# Patient Record
Sex: Male | Born: 1965 | Race: Black or African American | Hispanic: No | Marital: Married | Smoking: Never smoker
Health system: Southern US, Community
[De-identification: ages and names within clinical notes are randomized; demographics above are authoritative.]

## PROBLEM LIST (undated history)

## (undated) DIAGNOSIS — E119 Type 2 diabetes mellitus without complications: Secondary | ICD-10-CM

## (undated) DIAGNOSIS — I10 Essential (primary) hypertension: Secondary | ICD-10-CM

## (undated) DIAGNOSIS — Z87442 Personal history of urinary calculi: Secondary | ICD-10-CM

## (undated) DIAGNOSIS — C801 Malignant (primary) neoplasm, unspecified: Secondary | ICD-10-CM

## (undated) HISTORY — PX: CYSTOSCOPY/RETROGRADE/URETEROSCOPY/STONE EXTRACTION WITH BASKET: SHX5317

---

## 1999-02-20 ENCOUNTER — Emergency Department (HOSPITAL_COMMUNITY): Admission: EM | Admit: 1999-02-20 | Discharge: 1999-02-20 | Payer: Self-pay | Admitting: *Deleted

## 2000-06-28 ENCOUNTER — Emergency Department (HOSPITAL_COMMUNITY): Admission: EM | Admit: 2000-06-28 | Discharge: 2000-06-28 | Payer: Self-pay | Admitting: Emergency Medicine

## 2001-11-18 ENCOUNTER — Emergency Department (HOSPITAL_COMMUNITY): Admission: EM | Admit: 2001-11-18 | Discharge: 2001-11-18 | Payer: Self-pay | Admitting: Emergency Medicine

## 2002-07-15 ENCOUNTER — Emergency Department (HOSPITAL_COMMUNITY): Admission: EM | Admit: 2002-07-15 | Discharge: 2002-07-15 | Payer: Self-pay | Admitting: Emergency Medicine

## 2004-02-22 ENCOUNTER — Emergency Department (HOSPITAL_COMMUNITY): Admission: EM | Admit: 2004-02-22 | Discharge: 2004-02-22 | Payer: Self-pay

## 2004-03-01 ENCOUNTER — Ambulatory Visit (HOSPITAL_BASED_OUTPATIENT_CLINIC_OR_DEPARTMENT_OTHER): Admission: RE | Admit: 2004-03-01 | Discharge: 2004-03-01 | Payer: Self-pay | Admitting: Urology

## 2010-07-21 ENCOUNTER — Emergency Department (HOSPITAL_COMMUNITY)
Admission: EM | Admit: 2010-07-21 | Discharge: 2010-07-21 | Disposition: A | Discharge status: Home / Self Care | Payer: No Typology Code available for payment source | Attending: Emergency Medicine | Admitting: Emergency Medicine

## 2010-07-21 DIAGNOSIS — E7800 Pure hypercholesterolemia, unspecified: Secondary | ICD-10-CM | POA: Insufficient documentation

## 2010-07-21 DIAGNOSIS — IMO0002 Reserved for concepts with insufficient information to code with codable children: Secondary | ICD-10-CM | POA: Insufficient documentation

## 2010-07-24 ENCOUNTER — Other Ambulatory Visit: Payer: Self-pay | Admitting: Family Medicine

## 2010-07-24 ENCOUNTER — Ambulatory Visit
Admission: RE | Admit: 2010-07-24 | Discharge: 2010-07-24 | Disposition: A | Discharge status: Home / Self Care | Payer: PRIVATE HEALTH INSURANCE | Source: Ambulatory Visit | Attending: Family Medicine | Admitting: Family Medicine

## 2010-07-24 DIAGNOSIS — IMO0002 Reserved for concepts with insufficient information to code with codable children: Secondary | ICD-10-CM

## 2012-11-07 ENCOUNTER — Emergency Department (HOSPITAL_COMMUNITY): Payer: BC Managed Care – PPO

## 2012-11-07 ENCOUNTER — Emergency Department (HOSPITAL_COMMUNITY)
Admission: EM | Admit: 2012-11-07 | Discharge: 2012-11-07 | Disposition: A | Discharge status: Home / Self Care | Payer: BC Managed Care – PPO | Attending: Emergency Medicine | Admitting: Emergency Medicine

## 2012-11-07 ENCOUNTER — Encounter (HOSPITAL_COMMUNITY): Payer: Self-pay | Admitting: *Deleted

## 2012-11-07 DIAGNOSIS — R112 Nausea with vomiting, unspecified: Secondary | ICD-10-CM | POA: Insufficient documentation

## 2012-11-07 DIAGNOSIS — N200 Calculus of kidney: Secondary | ICD-10-CM | POA: Insufficient documentation

## 2012-11-07 DIAGNOSIS — M549 Dorsalgia, unspecified: Secondary | ICD-10-CM | POA: Insufficient documentation

## 2012-11-07 DIAGNOSIS — Z79899 Other long term (current) drug therapy: Secondary | ICD-10-CM | POA: Insufficient documentation

## 2012-11-07 DIAGNOSIS — E119 Type 2 diabetes mellitus without complications: Secondary | ICD-10-CM | POA: Insufficient documentation

## 2012-11-07 DIAGNOSIS — D649 Anemia, unspecified: Secondary | ICD-10-CM | POA: Insufficient documentation

## 2012-11-07 HISTORY — DX: Type 2 diabetes mellitus without complications: E11.9

## 2012-11-07 LAB — CBC WITH DIFFERENTIAL/PLATELET
Basophils Absolute: 0 10*3/uL (ref 0.0–0.1)
Basophils Relative: 0 % (ref 0–1)
Eosinophils Absolute: 0 10*3/uL (ref 0.0–0.7)
Eosinophils Relative: 0 % (ref 0–5)
HCT: 31.3 % — ABNORMAL LOW (ref 39.0–52.0)
Hemoglobin: 10.9 g/dL — ABNORMAL LOW (ref 13.0–17.0)
Lymphs Abs: 1.1 10*3/uL (ref 0.7–4.0)
MCH: 30.7 pg (ref 26.0–34.0)
MCHC: 34.8 g/dL (ref 30.0–36.0)
MCV: 88.2 fL (ref 78.0–100.0)
Monocytes Absolute: 0.6 10*3/uL (ref 0.1–1.0)
Monocytes Relative: 7 % (ref 3–12)
Neutro Abs: 6.8 10*3/uL (ref 1.7–7.7)
Neutrophils Relative %: 80 % — ABNORMAL HIGH (ref 43–77)
Platelets: 225 10*3/uL (ref 150–400)
RBC: 3.55 MIL/uL — ABNORMAL LOW (ref 4.22–5.81)
RDW: 13.9 % (ref 11.5–15.5)
WBC: 8.5 10*3/uL (ref 4.0–10.5)

## 2012-11-07 LAB — URINALYSIS W MICROSCOPIC + REFLEX CULTURE
Bilirubin Urine: NEGATIVE
Ketones, ur: 15 mg/dL — AB
Leukocytes, UA: NEGATIVE
Nitrite: NEGATIVE
Protein, ur: NEGATIVE mg/dL
Specific Gravity, Urine: 1.031 — ABNORMAL HIGH (ref 1.005–1.030)
Urobilinogen, UA: 0.2 mg/dL (ref 0.0–1.0)
pH: 5.5 (ref 5.0–8.0)

## 2012-11-07 LAB — GLUCOSE, CAPILLARY: Glucose-Capillary: 106 mg/dL — ABNORMAL HIGH (ref 70–99)

## 2012-11-07 LAB — POCT I-STAT, CHEM 8
BUN: 15 mg/dL (ref 6–23)
Calcium, Ion: 1.17 mmol/L (ref 1.12–1.23)
Chloride: 107 mEq/L (ref 96–112)
Creatinine, Ser: 1.2 mg/dL (ref 0.50–1.35)
Glucose, Bld: 137 mg/dL — ABNORMAL HIGH (ref 70–99)
HCT: 34 % — ABNORMAL LOW (ref 39.0–52.0)
Hemoglobin: 11.6 g/dL — ABNORMAL LOW (ref 13.0–17.0)
Potassium: 3.5 mEq/L (ref 3.5–5.1)
Sodium: 140 mEq/L (ref 135–145)
TCO2: 20 mmol/L (ref 0–100)

## 2012-11-07 MED ORDER — IBUPROFEN 800 MG PO TABS: 800.0000 mg | ORAL_TABLET | Freq: Three times a day (TID) | ORAL | Status: DC

## 2012-11-07 MED ORDER — TAMSULOSIN HCL 0.4 MG PO CAPS: 0.4000 mg | ORAL_CAPSULE | Freq: Every day | ORAL | Status: DC

## 2012-11-07 MED ORDER — HYDROMORPHONE HCL PF 1 MG/ML IJ SOLN
1.0000 mg | Freq: Once | INTRAMUSCULAR | Status: AC
  Administered 2012-11-07: 1 mg via INTRAVENOUS
  Filled 2012-11-07: qty 1

## 2012-11-07 MED ORDER — ONDANSETRON HCL 4 MG/2ML IJ SOLN
4.0000 mg | Freq: Once | INTRAMUSCULAR | Status: AC
  Administered 2012-11-07: 4 mg via INTRAVENOUS
  Filled 2012-11-07: qty 2

## 2012-11-07 MED ORDER — KETOROLAC TROMETHAMINE 30 MG/ML IJ SOLN
30.0000 mg | Freq: Once | INTRAMUSCULAR | Status: AC
  Administered 2012-11-07: 30 mg via INTRAVENOUS
  Filled 2012-11-07: qty 1

## 2012-11-07 MED ORDER — OXYCODONE-ACETAMINOPHEN 5-325 MG PO TABS: 1.0000 | ORAL_TABLET | ORAL | Status: DC | PRN

## 2012-11-07 NOTE — ED Notes (Signed)
Pt knows that urine is needed. Pt unable to void at this time.

## 2012-11-07 NOTE — ED Provider Notes (Signed)
CSN: [id number]     Arrival date & time 11/07/12  0518 History     First MD Initiated Contact with Patient 11/07/12 0622     Chief Complaint  Patient presents with  . Flank Pain   (Consider location/radiation/quality/duration/timing/severity/associated sxs/prior Treatment) HPI Patient is a 47 year old male who presents to emergency department today complaining of a severe right flank pain onset about 2 hours ago. Patient states pain woke him up from sleep. The patient states that he is having some nausea, vomiting. Patient denies any changes in bowels. Patient states he did urinate this morning and now is normal with no blood seen in the urine no dysuria. Patient does have history of kidney stones but unsure if this feels like her prior kidney stone. Patient denies any recent back injuries. Patient denies any pain lower extremities of unknown numbness or weakness in lower extremities. Patient denies any abdominal pain. Patient did not take any medications prior to coming in. Past Medical History  Diagnosis Date  . Diabetes mellitus without complication    History reviewed. No pertinent past surgical history. No family history on file. History  Substance Use Topics  . Smoking status: Never Smoker   . Smokeless tobacco: Not on file  . Alcohol Use: Yes    Review of Systems  Constitutional: Negative for fever and chills.  HENT: Negative for neck pain and neck stiffness.   Respiratory: Negative for cough, chest tightness and shortness of breath.   Cardiovascular: Negative for chest pain, palpitations and leg swelling.  Gastrointestinal: Positive for nausea and vomiting. Negative for abdominal pain, diarrhea and abdominal distention.  Genitourinary: Positive for flank pain. Negative for dysuria, urgency, frequency and hematuria.  Musculoskeletal: Positive for back pain. Negative for myalgias and arthralgias.  Skin: Negative for rash.  Allergic/Immunologic: Negative for immunocompromised  state.  Neurological: Negative for dizziness, weakness, light-headedness, numbness and headaches.    Allergies  Review of patient's allergies indicates no known allergies.  Home Medications   Current Outpatient Rx  Name  Route  Sig  Dispense  Refill  . acetaminophen (TYLENOL) 500 MG tablet   Oral   Take 1,000 mg by mouth every 6 (six) hours as needed for pain.         Marland Kitchen atorvastatin (LIPITOR) 40 MG tablet   Oral   Take 40 mg by mouth daily.         . metFORMIN (GLUCOPHAGE) 500 MG tablet   Oral   Take 500 mg by mouth 2 (two) times daily with a meal.          BP 154/104  Pulse 78  Temp(Src) 98.6 F (37 C) (Oral)  Resp 22  SpO2 100% Physical Exam  Nursing note and vitals reviewed. Constitutional: He appears well-developed and well-nourished.  Pt appears in pain, he is moving around in bed unable to find comfortable position  HENT:  Head: Normocephalic and atraumatic.  Eyes: Conjunctivae are normal.  Neck: Neck supple.  Cardiovascular: Normal rate, regular rhythm and normal heart sounds.   Pulmonary/Chest: Effort normal. No respiratory distress. He has no wheezes. He has no rales.  Abdominal: Soft. Bowel sounds are normal. He exhibits no distension. There is no tenderness. There is no rebound.  Musculoskeletal: He exhibits no edema.  No lumbar midline or perivertebral tenderness  Neurological: He is alert.  Skin: Skin is warm and dry.    ED Course   Procedures (including critical care time)  Results for orders placed during the hospital encounter of  11/07/12  GLUCOSE, CAPILLARY      Result Value Range   Glucose-Capillary 106 (*) 70 - 99 mg/dL  URINALYSIS W MICROSCOPIC + REFLEX CULTURE      Result Value Range   Color, Urine YELLOW  YELLOW   APPearance CLOUDY (*) CLEAR   Specific Gravity, Urine 1.031 (*) 1.005 - 1.030   pH 5.5  5.0 - 8.0   Glucose, UA NEGATIVE  NEGATIVE mg/dL   Hgb urine dipstick SMALL (*) NEGATIVE   Bilirubin Urine NEGATIVE  NEGATIVE    Ketones, ur 15 (*) NEGATIVE mg/dL   Protein, ur NEGATIVE  NEGATIVE mg/dL   Urobilinogen, UA 0.2  0.0 - 1.0 mg/dL   Nitrite NEGATIVE  NEGATIVE   Leukocytes, UA NEGATIVE  NEGATIVE   RBC / HPF 0-2  <3 RBC/hpf  CBC WITH DIFFERENTIAL      Result Value Range   WBC 8.5  4.0 - 10.5 K/uL   RBC 3.55 (*) 4.22 - 5.81 MIL/uL   Hemoglobin 10.9 (*) 13.0 - 17.0 g/dL   HCT 16.1 (*) 09.6 - 04.5 %   MCV 88.2  78.0 - 100.0 fL   MCH 30.7  26.0 - 34.0 pg   MCHC 34.8  30.0 - 36.0 g/dL   RDW 40.9  81.1 - 91.4 %   Platelets 225  150 - 400 K/uL   Neutrophils Relative % 80 (*) 43 - 77 %   Neutro Abs 6.8  1.7 - 7.7 K/uL   Lymphocytes Relative 13  12 - 46 %   Lymphs Abs 1.1  0.7 - 4.0 K/uL   Monocytes Relative 7  3 - 12 %   Monocytes Absolute 0.6  0.1 - 1.0 K/uL   Eosinophils Relative 0  0 - 5 %   Eosinophils Absolute 0.0  0.0 - 0.7 K/uL   Basophils Relative 0  0 - 1 %   Basophils Absolute 0.0  0.0 - 0.1 K/uL  POCT I-STAT, CHEM 8      Result Value Range   Sodium 140  135 - 145 mEq/L   Potassium 3.5  3.5 - 5.1 mEq/L   Chloride 107  96 - 112 mEq/L   BUN 15  6 - 23 mg/dL   Creatinine, Ser 7.82  0.50 - 1.35 mg/dL   Glucose, Bld 956 (*) 70 - 99 mg/dL   Calcium, Ion 2.13  0.86 - 1.23 mmol/L   TCO2 20  0 - 100 mmol/L   Hemoglobin 11.6 (*) 13.0 - 17.0 g/dL   HCT 57.8 (*) 46.9 - 62.9 %   Ct Abdomen Pelvis Wo Contrast  11/07/2012   CLINICAL DATA:  Severe right flank pain. Nausea and vomiting. Diarrhea.  EXAM: CT ABDOMEN AND PELVIS WITHOUT CONTRAST  TECHNIQUE: Multidetector CT imaging of the abdomen and pelvis was performed following the standard protocol without intravenous contrast.  COMPARISON:  02/22/2004  FINDINGS: Multiple tiny less than 5 mm intrarenal calculi are seen bilaterally. Mild right hydronephrosis is seen, with a 5 mm calculus at the right ureteropelvic junction. No distal ureteral calculi visualized. Subparagraph noncontrast images of the head abdominal parenchymal organs are unremarkable as well  as the gallbladder. No soft tissue mass or inflammatory process identified. No evidence of dilated bowel loops or hernia.  IMPRESSION: 5 mm calculus at the right ureteropelvic junction, causing mild hydronephrosis.  Bilateral nephrolithiasis.   Electronically Signed   By: Myles Rosenthal   On: 11/07/2012 07:25     No results found. 1. Kidney stone on  right side   2. Anemia     MDM  PT with hx of kidney stones. Here in ED with complaint of a right upper flank pain that started just a few hours prior to arrival. Patient had a workup including blood work and noncontrasted CT abdomen and pelvis. His blood work is unremarkable except for slightly decreased hemoglobin level. Patient's CT scan showed a 5 mm calculus to the right ureteropelvic junction causing mild hydronephrosis. Patient was treated in March department with IV Dilaudid and Toradol. Patient is completely pain-free at this time. UA does not appear to be infected.  patient will be discharged home with followup with urology. Results plan discussed with patient and he agrees with it.  Filed Vitals:   11/07/12 0653 11/07/12 0700 11/07/12 0815 11/07/12 0954  BP: 154/104 146/97 124/77 120/74  Pulse:  78 92   Temp:      TempSrc:      Resp:    18  SpO2:  98% 98% 99%     Lottie Mussel, PA-C 11/07/12 1644

## 2012-11-07 NOTE — ED Notes (Signed)
The pt is c/o severe flank pain for 3-4 hours with n v and diarrhea.  Rt flank .  No bloody urine no difficulty voiding

## 2012-11-08 NOTE — ED Provider Notes (Signed)
Medical screening examination/treatment/procedure(s) were performed by non-physician practitioner and as supervising physician I was immediately available for consultation/collaboration.  Francisca Langenderfer, MD 11/08/12 0255 

## 2012-11-16 ENCOUNTER — Other Ambulatory Visit: Payer: Self-pay | Admitting: Urology

## 2012-11-17 ENCOUNTER — Encounter (HOSPITAL_COMMUNITY): Payer: Self-pay | Admitting: Pharmacy Technician

## 2012-11-17 ENCOUNTER — Encounter (HOSPITAL_COMMUNITY): Payer: Self-pay

## 2012-11-17 ENCOUNTER — Other Ambulatory Visit: Payer: Self-pay | Admitting: Urology

## 2012-11-17 ENCOUNTER — Encounter (HOSPITAL_COMMUNITY)
Admission: RE | Admit: 2012-11-17 | Discharge: 2012-11-17 | Disposition: A | Discharge status: Home / Self Care | Payer: BC Managed Care – PPO | Source: Ambulatory Visit | Attending: Urology | Admitting: Urology

## 2012-11-17 HISTORY — DX: Personal history of urinary calculi: Z87.442

## 2012-11-17 NOTE — Patient Instructions (Signed)
YOUR SURGERY IS SCHEDULED AT [facility]  ON:  Thursday  8/28  REPORT TO [place] [facility]AY [facility] AT:  7:30 AM      PHONE # FOR SHORT STAY IS [phone]  DO NOT EAT OR DRINK ANYTHING AFTER MIDNIGHT THE NIGHT BEFORE YOUR SURGERY.  YOU MAY BRUSH YOUR TEETH, RINSE OUT YOUR MOUTH--BUT NO WATER, NO FOOD, NO CHEWING GUM, NO MINTS, NO CANDIES, NO CHEWING TOBACCO.  PLEASE TAKE THE FOLLOWING MEDICATIONS THE AM OF YOUR SURGERY WITH A FEW SIPS OF WATER: PAIN MEDICATION IF NEEDED ( OXYCODONE/ACETAMINOPHEN)    IF YOU ARE DIABETIC:  DO NOT TAKE ANY DIABETIC MEDICATIONS THE AM OF YOUR SURGERY.    DO NOT BRING VALUABLES, MONEY, CREDIT CARDS.  DO NOT WEAR JEWELRY, MAKE-UP, NAIL POLISH AND NO METAL PINS OR CLIPS IN YOUR HAIR. CONTACT LENS, DENTURES / PARTIALS, GLASSES SHOULD NOT BE WORN TO SURGERY AND IN MOST CASES-HEARING AIDS WILL NEED TO BE REMOVED.  BRING YOUR GLASSES CASE, ANY EQUIPMENT NEEDED FOR YOUR CONTACT LENS. FOR PATIENTS ADMITTED TO THE HOSPITAL--CHECK OUT TIME THE DAY OF DISCHARGE IS 11:00 AM.  ALL INPATIENT ROOMS ARE PRIVATE - WITH BATHROOM, TELEPHONE, TELEVISION AND WIFI INTERNET.  IF YOU ARE BEING DISCHARGED THE SAME DAY OF YOUR SURGERY--YOU CAN NOT DRIVE YOURSELF HOME--AND SHOULD NOT GO HOME ALONE BY TAXI OR BUS.  NO DRIVING OR OPERATING MACHINERY FOR 24 HOURS FOLLOWING ANESTHESIA / PAIN MEDICATIONS.  PLEASE MAKE ARRANGEMENTS FOR SOMEONE TO BE WITH YOU AT HOME THE FIRST 24 HOURS AFTER SURGERY. RESPONSIBLE DRIVER'S NAME  CHERI Pennock                                               PHONE #  [phone]                            FAILURE TO FOLLOW THESE INSTRUCTIONS MAY RESULT IN THE CANCELLATION OF YOUR SURGERY.   PATIENT SIGNATURE_________________________________

## 2012-11-17 NOTE — Pre-Procedure Instructions (Signed)
PT HAD CBC AND I STAT CHEM 8 DONE IN ER 11/07/12 - RESULTS ARE IN EPIC. EKG WAS DONE TODAY AT [facility].

## 2012-11-18 ENCOUNTER — Encounter (HOSPITAL_COMMUNITY): Payer: Self-pay | Admitting: *Deleted

## 2012-11-18 ENCOUNTER — Encounter (HOSPITAL_COMMUNITY): Payer: Self-pay | Admitting: Certified Registered Nurse Anesthetist

## 2012-11-18 ENCOUNTER — Encounter (HOSPITAL_COMMUNITY)
Admission: RE | Disposition: A | Discharge status: Home / Self Care | Payer: Self-pay | Source: Ambulatory Visit | Attending: Urology

## 2012-11-18 ENCOUNTER — Ambulatory Visit (HOSPITAL_COMMUNITY): Payer: BC Managed Care – PPO | Admitting: Certified Registered Nurse Anesthetist

## 2012-11-18 ENCOUNTER — Ambulatory Visit (HOSPITAL_COMMUNITY)
Admission: RE | Admit: 2012-11-18 | Discharge: 2012-11-18 | Disposition: A | Discharge status: Home / Self Care | Payer: BC Managed Care – PPO | Source: Ambulatory Visit | Attending: Urology | Admitting: Urology

## 2012-11-18 DIAGNOSIS — N200 Calculus of kidney: Secondary | ICD-10-CM

## 2012-11-18 DIAGNOSIS — E119 Type 2 diabetes mellitus without complications: Secondary | ICD-10-CM | POA: Insufficient documentation

## 2012-11-18 DIAGNOSIS — N201 Calculus of ureter: Secondary | ICD-10-CM | POA: Insufficient documentation

## 2012-11-18 HISTORY — PX: CYSTOSCOPY WITH RETROGRADE PYELOGRAM, URETEROSCOPY AND STENT PLACEMENT: SHX5789

## 2012-11-18 HISTORY — PX: HOLMIUM LASER APPLICATION: SHX5852

## 2012-11-18 LAB — GLUCOSE, CAPILLARY: Glucose-Capillary: 99 mg/dL (ref 70–99)

## 2012-11-18 SURGERY — CYSTOURETEROSCOPY, WITH RETROGRADE PYELOGRAM AND STENT INSERTION
Anesthesia: General | Laterality: Right | Wound class: Clean Contaminated

## 2012-11-18 MED ORDER — ACETAMINOPHEN 500 MG PO TABS
1000.0000 mg | ORAL_TABLET | Freq: Once | ORAL | Status: AC
  Administered 2012-11-18: 1000 mg via ORAL
  Filled 2012-11-18: qty 2

## 2012-11-18 MED ORDER — CEFAZOLIN SODIUM-DEXTROSE 2-3 GM-% IV SOLR
INTRAVENOUS | Status: AC
  Filled 2012-11-18: qty 50

## 2012-11-18 MED ORDER — LACTATED RINGERS IV SOLN: INTRAVENOUS | Status: DC

## 2012-11-18 MED ORDER — FENTANYL CITRATE 0.05 MG/ML IJ SOLN
INTRAMUSCULAR | Status: DC | PRN
  Administered 2012-11-18 (×4): 50 ug via INTRAVENOUS

## 2012-11-18 MED ORDER — LACTATED RINGERS IV SOLN
INTRAVENOUS | Status: DC
  Administered 2012-11-18: 1000 mL via INTRAVENOUS

## 2012-11-18 MED ORDER — FENTANYL CITRATE 0.05 MG/ML IJ SOLN
INTRAMUSCULAR | Status: AC
  Filled 2012-11-18: qty 2

## 2012-11-18 MED ORDER — PROPOFOL 10 MG/ML IV BOLUS
INTRAVENOUS | Status: DC | PRN
  Administered 2012-11-18: 200 mg via INTRAVENOUS

## 2012-11-18 MED ORDER — LIDOCAINE HCL (CARDIAC) 20 MG/ML IV SOLN
INTRAVENOUS | Status: DC | PRN
  Administered 2012-11-18: 100 mg via INTRAVENOUS

## 2012-11-18 MED ORDER — CIPROFLOXACIN HCL 250 MG PO TABS: 250.0000 mg | ORAL_TABLET | Freq: Two times a day (BID) | ORAL | Status: DC

## 2012-11-18 MED ORDER — ACETAMINOPHEN 500 MG PO TABS
ORAL_TABLET | ORAL | Status: AC
  Administered 2012-11-18: 1000 mg via ORAL
  Filled 2012-11-18: qty 2

## 2012-11-18 MED ORDER — URIBEL 118 MG PO CAPS: 1.0000 | ORAL_CAPSULE | Freq: Three times a day (TID) | ORAL | Status: AC | PRN

## 2012-11-18 MED ORDER — FENTANYL CITRATE 0.05 MG/ML IJ SOLN
25.0000 ug | INTRAMUSCULAR | Status: DC | PRN
  Administered 2012-11-18: 25 ug via INTRAVENOUS
  Administered 2012-11-18: 50 ug via INTRAVENOUS

## 2012-11-18 MED ORDER — URELLE 81 MG PO TABS
1.0000 | ORAL_TABLET | Freq: Once | ORAL | Status: AC
  Administered 2012-11-18: 81 mg via ORAL
  Filled 2012-11-18: qty 1

## 2012-11-18 MED ORDER — IOHEXOL 300 MG/ML  SOLN
INTRAMUSCULAR | Status: AC
  Filled 2012-11-18: qty 1

## 2012-11-18 MED ORDER — CEFAZOLIN SODIUM-DEXTROSE 2-3 GM-% IV SOLR
2.0000 g | INTRAVENOUS | Status: AC
  Administered 2012-11-18: 2 g via INTRAVENOUS

## 2012-11-18 MED ORDER — MIDAZOLAM HCL 5 MG/5ML IJ SOLN
INTRAMUSCULAR | Status: DC | PRN
  Administered 2012-11-18: 2 mg via INTRAVENOUS

## 2012-11-18 MED ORDER — SODIUM CHLORIDE 0.9 % IR SOLN
Status: DC | PRN
  Administered 2012-11-18: 5000 mL

## 2012-11-18 MED ORDER — IOHEXOL 300 MG/ML  SOLN
INTRAMUSCULAR | Status: DC | PRN
  Administered 2012-11-18: 10 mL via URETHRAL

## 2012-11-18 MED ORDER — ONDANSETRON HCL 4 MG/2ML IJ SOLN
INTRAMUSCULAR | Status: DC | PRN
  Administered 2012-11-18: 4 mg via INTRAVENOUS

## 2012-11-18 SURGICAL SUPPLY — 22 items
BAG URO CATCHER STRL LF (DRAPE) ×2 IMPLANT
CATH CLEAR GEL 3F BACKSTOP (CATHETERS) ×1 IMPLANT
CATH INTERMIT  6FR 70CM (CATHETERS) ×1 IMPLANT
CATH URET 5FR 28IN CONE TIP (BALLOONS)
CATH URET 5FR 28IN OPEN ENDED (CATHETERS) ×1 IMPLANT
CATH URET 5FR 70CM CONE TIP (BALLOONS) ×1 IMPLANT
CLOTH BEACON ORANGE TIMEOUT ST (SAFETY) ×2 IMPLANT
DRAPE CAMERA CLOSED 9X96 (DRAPES) ×2 IMPLANT
GLOVE BIOGEL M 8.0 STRL (GLOVE) ×4 IMPLANT
GLOVE SURG SS PI 8.0 STRL IVOR (GLOVE) ×1 IMPLANT
GOWN PREVENTION PLUS XLARGE (GOWN DISPOSABLE) ×1 IMPLANT
GOWN STRL REIN XL XLG (GOWN DISPOSABLE) ×2 IMPLANT
GUIDEWIRE ANG ZIPWIRE 038X150 (WIRE) ×1 IMPLANT
GUIDEWIRE STR DUAL SENSOR (WIRE) ×3 IMPLANT
LASER FIBER DISP (UROLOGICAL SUPPLIES) ×1 IMPLANT
MANIFOLD NEPTUNE II (INSTRUMENTS) ×2 IMPLANT
MARKER SKIN DUAL TIP RULER LAB (MISCELLANEOUS) ×1 IMPLANT
PACK CYSTO (CUSTOM PROCEDURE TRAY) ×2 IMPLANT
SHEATH ACCESS URETERAL 38CM (SHEATH) ×1 IMPLANT
STENT CONTOUR 6FRX24X.038 (STENTS) ×1 IMPLANT
TUBING CONNECTING 10 (TUBING) ×2 IMPLANT
WIRE COONS/BENSON .038X145CM (WIRE) ×1 IMPLANT

## 2012-11-18 NOTE — H&P (Signed)
  Urology History and Physical Exam  CC: Kidney stone   HPI: 47 year old male presents for ureteroscopic treatment of a symptomatic right mid ureteral stone.He originally presented to Dr. Vernie Ammons on 8/18 for followup. His note is below:  He presented to the emergency room on 11/07/12 with acute onset right flank pain. A CT scan at that time revealed a 5 mm right UPJ stone as well as nonobstructing bilateral renal calculi that are essentially unchanged from 2005. The stone in his proximal ureter is the largest stone that was in his right kidney previously. He has been having moderate pain and finds that the oxycodone has not been efficient at controlling his pain. He's not having any nausea, vomiting or hematuria. His pain is moderate in severity. Is not modified by positional change.  PMH: Past Medical History  Diagnosis Date  . Diabetes mellitus without complication     ORAL MED - NEW DIAGNOSIS  . History of kidney stones     RIGHT SIDE - CAUSING SEVERE PAIN - PREVIOUS STONE/ SURG    PSH: Past Surgical History  Procedure Laterality Date  . Cystoscopy/retrograde/ureteroscopy/stone extraction with basket      Allergies: No Known Allergies  Medications: No prescriptions prior to admission     Social History: History   Social History  . Marital Status: Married    Spouse Name: N/A    Number of Children: N/A  . Years of Education: N/A   Occupational History  . Not on file.   Social History Main Topics  . Smoking status: Never Smoker   . Smokeless tobacco: Never Used  . Alcohol Use: Yes     Comment: OCCAS  . Drug Use: No  . Sexual Activity: Not on file   Other Topics Concern  . Not on file   Social History Narrative  . No narrative on file    Family History: No family history on file.  Review of Systems: Positive:  Negative:   A further 10 point review of systems was negative except what is listed in the HPI.  Physical Exam: @VITALS2 @ Constitutional: Well  nourished and well developed . No acute distress.  ENT:. The ears and nose are normal in appearance.  Neck: The appearance of the neck is normal and no neck mass is present.  Pulmonary: No respiratory distress and normal respiratory rhythm and effort.  Cardiovascular: Heart rate and rhythm are normal . No peripheral edema.  Abdomen: The abdomen is soft and nontender. No masses are palpated. No CVA tenderness. No hernias are palpable. No hepatosplenomegaly noted.  Lymphatics: The femoral and inguinal nodes are not enlarged or tender.  Skin: Normal skin turgor, no visible rash and no visible skin lesions.  Neuro/Psych:. Mood and affect are appropriate.      Studies:  No results found for this basename: HGB, WBC, PLT,  in the last 72 hours  No results found for this basename: NA, K, CL, CO2, BUN, CREATININE, CALCIUM, MAGNESIUM, GFRNONAA, GFRAA,  in the last 72 hours   No results found for this basename: PT, INR, APTT,  in the last 72 hours   No components found with this basename: ABG,     Assessment:  5mm right mid ureteral stone  Plan: Right ureteroscopic management of 5 mm right mid ureteral stone

## 2012-11-18 NOTE — Interval H&P Note (Signed)
History and Physical Interval Note:  11/18/2012 9:24 AM  William Kaiser  has presented today for surgery, with the diagnosis of RIGHT DISTAL URETERAL STONE  The various methods of treatment have been discussed with the patient and family. After consideration of risks, benefits and other options for treatment, the patient has consented to  Procedure(s): CYSTOSCOPY WITH RETROGRADE PYELOGRAM, URETEROSCOPY AND STONE EXTRACTION AND POSSIBLE DOUBLE J STENT PLACEMENT (Right) HOLMIUM LASER APPLICATION (Right) as a surgical intervention .  The patient's history has been reviewed, patient examined, no change in status, stable for surgery.  I have reviewed the patient's chart and labs.  Questions were answered to the patient's satisfaction.     Chelsea Aus

## 2012-11-18 NOTE — Op Note (Signed)
Preoperative diagnosis: 5 mm right ureteral stone, proximal, as well as small renal calculi  Postoperative diagnosis: Same   Procedure: Cystoscopy, right retrograde ureteropyelogram with interpretation of fluoroscopy, rigid and flexible ureteroscopy, holmium laser and extraction of right ureteral calculus, placement of 24 cm x 6 French contour stent with string    Surgeon: Bertram Millard. Karole Oo, M.D.   Anesthesia: Gen.   Complications: None  Specimen(s): Stone fragments, the patient's wife  Drain(s): 24 cm x 6 French contour stent  Indications: 47 year-old male with symptomatic 5-6 mm proximal right ureteral stone. He seeks treatment for this, as he has been under a fair amount of pain, and desires ureteroscopy over lithotripsy. He was seen by Dr. Vernie Ammons in by Ardell Isaacs, nurse practitioner in our office within the past 2 weeks. Because of his urgent need for stone management, he was scheduled for me to perform this procedure. I have met with the patient prior to the procedure, discussed it with him in depth, as well as risks and complications. These include but are not limited to bleeding, ureteral trauma, infection, flank pain, among others. He understands this and desires to proceed.    Technique and findings: The patient was properly identified and marked in the holding area. He received preoperative IV Ancef. He was taken the operating room where general anesthetic was administered. He was placed in the dorsolithotomy position. Genitalia and perineum were prepped and draped. Proper timeout was then performed.  I then passed a 22 French cystoscope through his urethra which was normal prostate was nonobstructive. I entered the bladder and inspected circumferentially. There were no tumors, trabeculations or foreign bodies. Both ureteral orifices were normal in configuration and location. A right retrograde ureteropyelogram was then performed. This revealed a normal ureter with a filling  defect seen consistent with the stone. There was not significant proximal hydroureteronephrosis. Pyelocalyceal system was normal. I saw no significant filling defects.  At this point, I then removed the cystoscope. I dilated the distal ureter with the inner core of a digital access sheath. I then passed the ureteroscope, with the use of the additional guidewire through the ureteroscope due to angulation of the ureter. I then encountered the stone proximally in the ureter. Distal, there were no lesions or stones. Because the stone was quite high in the ureter, it applied backstop proximal to the stone. I then passed a 360  laser fiber and applied laser energy to the stone, fragmenting it into approximately 10 pieces. These were then easily extracted down to the bladder. I then inspected the spot where the stone had been treated. There were just small dustlike fragments left in this area. I then removed the ureteroscope. I passed a digital access sheath proximally in the ureter over the guidewire. I then passed, through this, the digital ureteroscope. I inspected the calyceal system. I saw no significant stones that would need to be extracted. The entire pyelocalyceal system was inspected with the flexible scope. There were couple very small fragments which I thought would easily pass along the stent following the procedure. These were quite small and I felt would not be able to be grasped with the basket. The entire right kidney having been inspected, I removed the ureteroscope and the digital access sheath. Over top of the guidewire, using cystoscopic guidance, I then placed a 24 cm x 6 French contour stent. Good proximal and distal curls were seen fluoroscopically and cystoscopically at this point. The string was left on. The bladder was drained.  Small fragments were aspirated from the bladder and capped. The string was brought through the urethra after the bladder was drained and the scope removed.  The  patient tolerated procedure well. He was awakened and taken to the PACU in stable condition.  He will followup on September 12. He was discharged on Cipro and his pain medications as well as uribel. If he does well at the followup, I would recommend that he followup in approximately 6-12 months with repeat KUB.

## 2012-11-18 NOTE — Anesthesia Preprocedure Evaluation (Addendum)
Anesthesia Evaluation  Patient identified by MRN, date of birth, ID band Patient awake    Reviewed: Allergy & Precautions, H&P , NPO status , Patient's Chart, lab work & pertinent test results  Airway Mallampati: II TM Distance: >3 FB Neck ROM: full    Dental no notable dental hx. (+) Teeth Intact and Dental Advisory Given   Pulmonary neg pulmonary ROS,  breath sounds clear to auscultation  Pulmonary exam normal       Cardiovascular Exercise Tolerance: Good negative cardio ROS  Rhythm:regular Rate:Normal     Neuro/Psych negative neurological ROS  negative psych ROS   GI/Hepatic negative GI ROS, Neg liver ROS,   Endo/Other  diabetes, Well Controlled, Type 2, Oral Hypoglycemic Agents  Renal/GU negative Renal ROS  negative genitourinary   Musculoskeletal   Abdominal   Peds  Hematology negative hematology ROS (+)   Anesthesia Other Findings   Reproductive/Obstetrics negative OB ROS                          Anesthesia Physical Anesthesia Plan  ASA: II  Anesthesia Plan: General   Post-op Pain Management:    Induction: Intravenous  Airway Management Planned: LMA  Additional Equipment:   Intra-op Plan:   Post-operative Plan:   Informed Consent: I have reviewed the patients History and Physical, chart, labs and discussed the procedure including the risks, benefits and alternatives for the proposed anesthesia with the patient or authorized representative who has indicated his/her understanding and acceptance.   Dental Advisory Given  Plan Discussed with: CRNA and Surgeon  Anesthesia Plan Comments:         Anesthesia Quick Evaluation  

## 2012-11-18 NOTE — Transfer of Care (Signed)
Immediate Anesthesia Transfer of Care Note  Patient: William Kaiser  Procedure(s) Performed: Procedure(s): CYSTOSCOPY WITH RETROGRADE PYELOGRAM, URETEROSCOPY AND STONE EXTRACTION AND right DOUBLE J STENT PLACEMENT (Right) HOLMIUM LASER APPLICATION (Right)  Patient Location: PACU  Anesthesia Type:General  Level of Consciousness: awake and alert   Airway & Oxygen Therapy: Patient Spontanous Breathing and Patient connected to face mask oxygen  Post-op Assessment: Report given to PACU RN and Post -op Vital signs reviewed and stable  Post vital signs: Reviewed and stable  Complications: No apparent anesthesia complications

## 2012-11-18 NOTE — Anesthesia Postprocedure Evaluation (Signed)
  Anesthesia Post-op Note  Patient: William Kaiser  Procedure(s) Performed: Procedure(s) (LRB): CYSTOSCOPY WITH RETROGRADE PYELOGRAM, URETEROSCOPY AND STONE EXTRACTION AND right DOUBLE J STENT PLACEMENT (Right) HOLMIUM LASER APPLICATION (Right)  Patient Location: PACU  Anesthesia Type: General  Level of Consciousness: awake and alert   Airway and Oxygen Therapy: Patient Spontanous Breathing  Post-op Pain: mild  Post-op Assessment: Post-op Vital signs reviewed, Patient's Cardiovascular Status Stable, Respiratory Function Stable, Patent Airway and No signs of Nausea or vomiting  Last Vitals:  Filed Vitals:   11/18/12 1200  BP: 150/87  Pulse: 60  Temp: 36.1 C  Resp: 12    Post-op Vital Signs: stable   Complications: No apparent anesthesia complications

## 2012-11-18 NOTE — Preoperative (Signed)
Beta Blockers   Reason not to administer Beta Blockers:Not Applicable 

## 2012-11-19 ENCOUNTER — Encounter (HOSPITAL_COMMUNITY): Payer: Self-pay | Admitting: Urology

## 2012-11-26 ENCOUNTER — Encounter (HOSPITAL_BASED_OUTPATIENT_CLINIC_OR_DEPARTMENT_OTHER): Admission: RE | Payer: Self-pay | Source: Ambulatory Visit

## 2012-11-26 ENCOUNTER — Ambulatory Visit (HOSPITAL_BASED_OUTPATIENT_CLINIC_OR_DEPARTMENT_OTHER): Admission: RE | Admit: 2012-11-26 | Payer: PRIVATE HEALTH INSURANCE | Source: Ambulatory Visit | Admitting: Urology

## 2012-11-26 SURGERY — CYSTOURETEROSCOPY, WITH RETROGRADE PYELOGRAM AND STENT INSERTION
Anesthesia: General | Laterality: Right

## 2015-04-12 IMAGING — CT CT ABD-PELV W/O CM
2 of 4 series · 17 of 46 positions shown, 19 images · non-contrast
Comparison: 02/22/2004

CLINICAL DATA: Severe right flank pain. Nausea and vomiting.
Diarrhea.

EXAM:
CT ABDOMEN AND PELVIS WITHOUT CONTRAST
TECHNIQUE: Multidetector CT imaging of the abdomen and pelvis was performed
following the standard protocol without intravenous contrast.

[Series 2: stone study 5.0 i30f 1 · axial · 0.88mm/px · z∈[-1029,-579]mm · 14 of 100 slices shown, 16 images]
[im 5/100  soft-tissue]
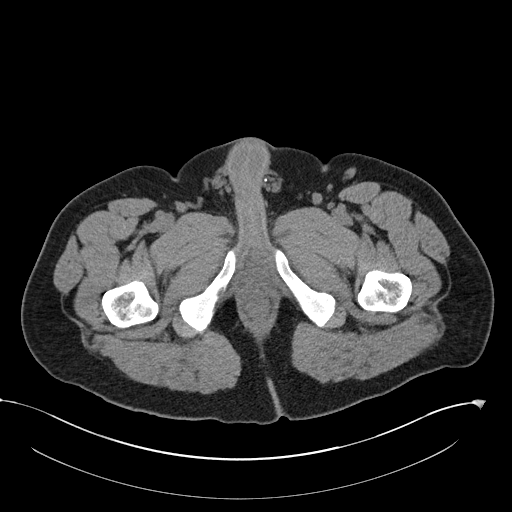
[im 5/100  bone]
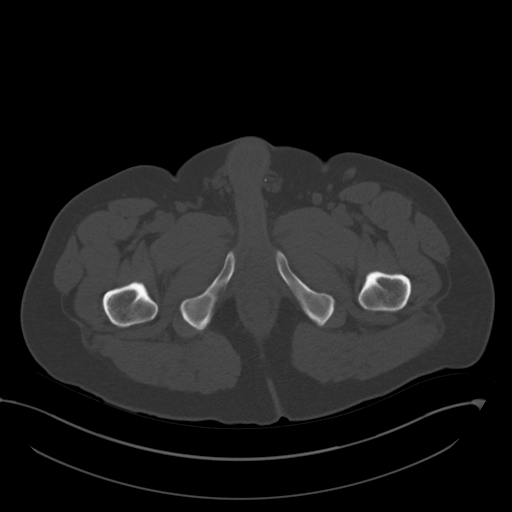
[im 13/100  soft-tissue]
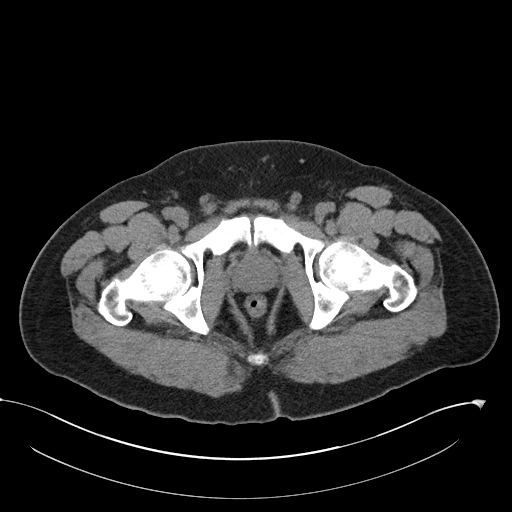
[im 18/100  soft-tissue]
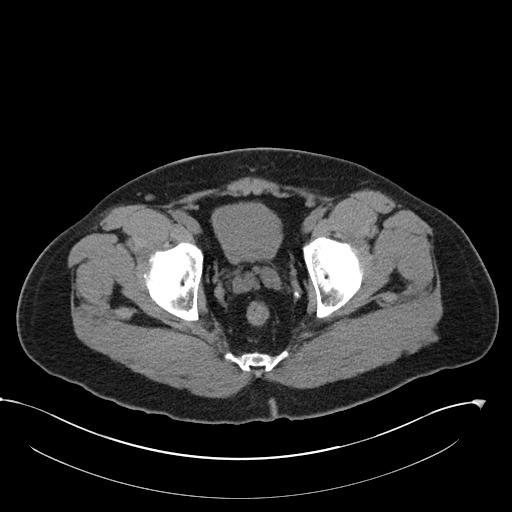
[im 26/100  soft-tissue]
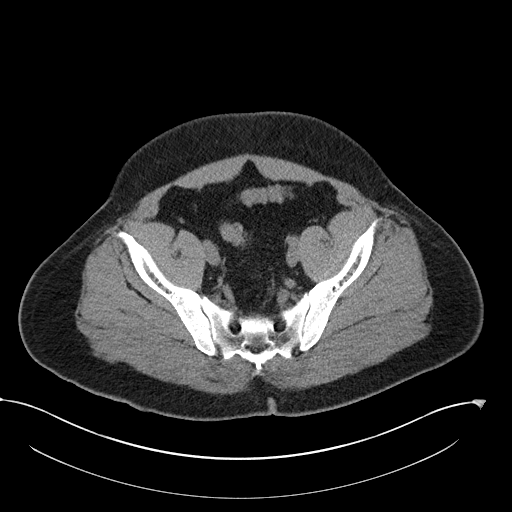
[im 35/100  soft-tissue]
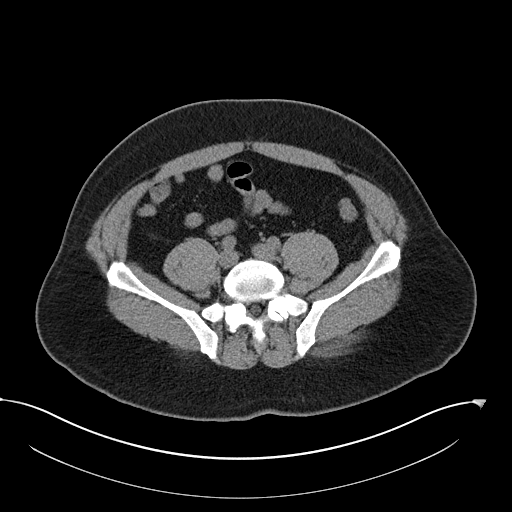
[im 39/100  soft-tissue]
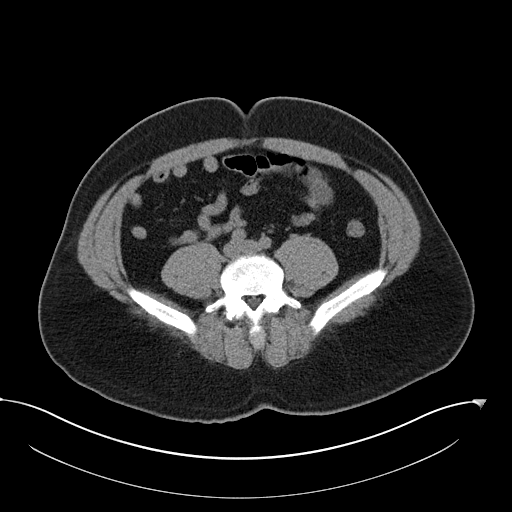
[im 48/100  soft-tissue]
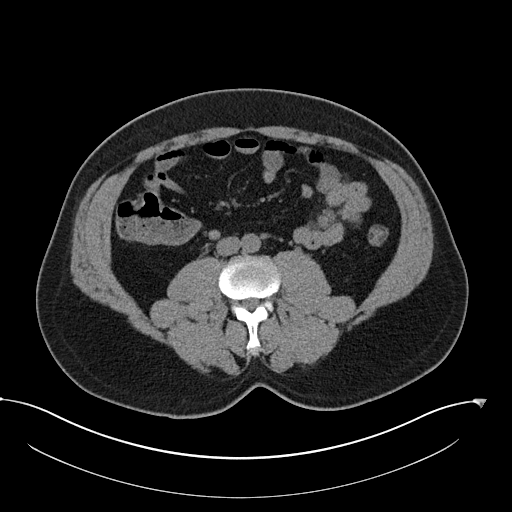
[im 52/100  soft-tissue]
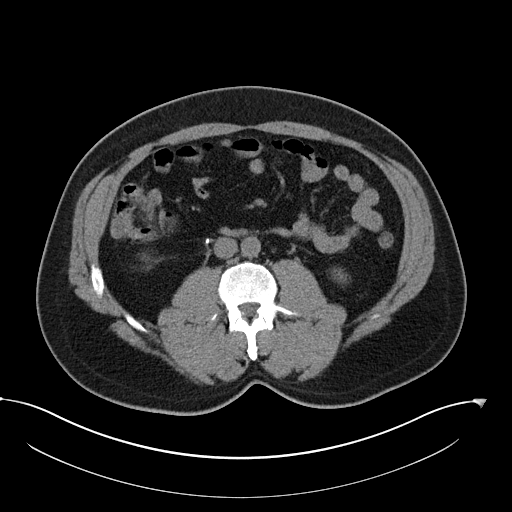
[im 61/100  soft-tissue]
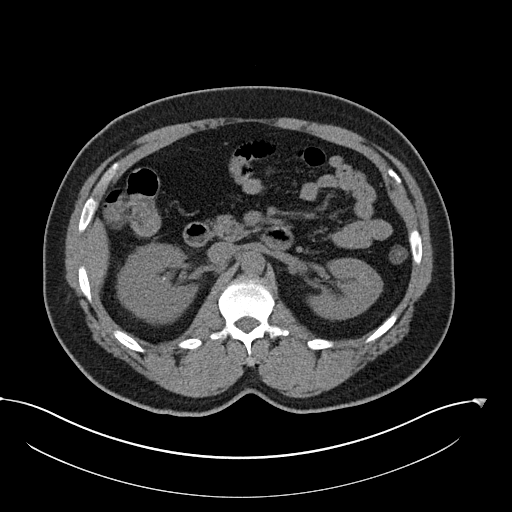
[im 61/100  bone]
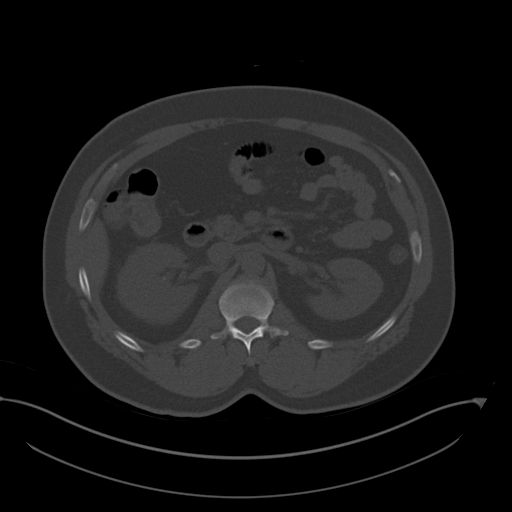
[im 65/100  soft-tissue]
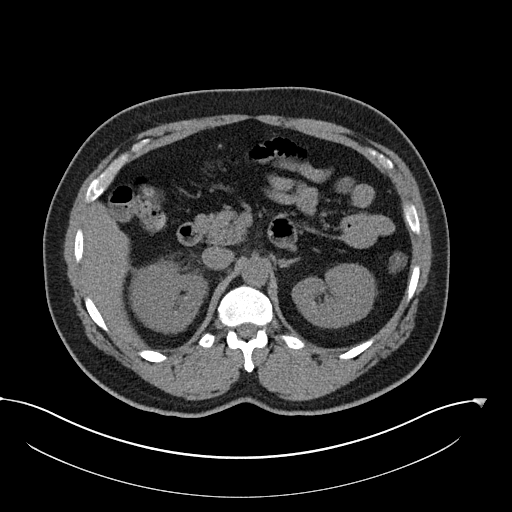
[im 74/100  soft-tissue]
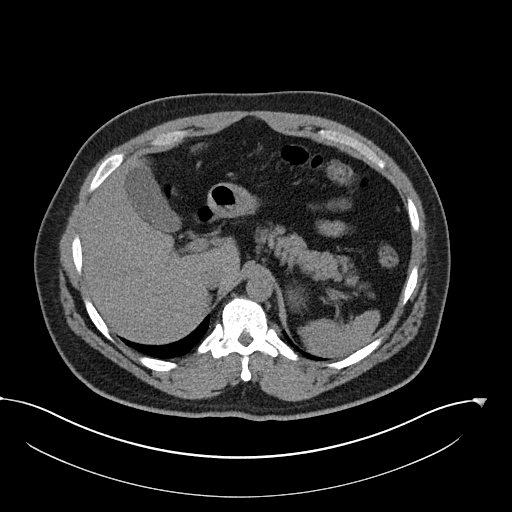
[im 82/100  soft-tissue]
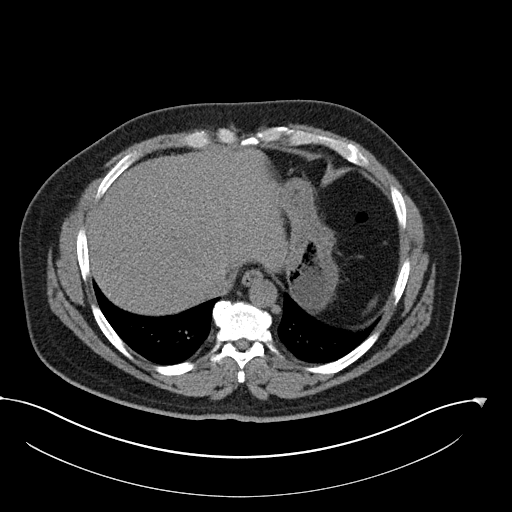
[im 87/100  soft-tissue]
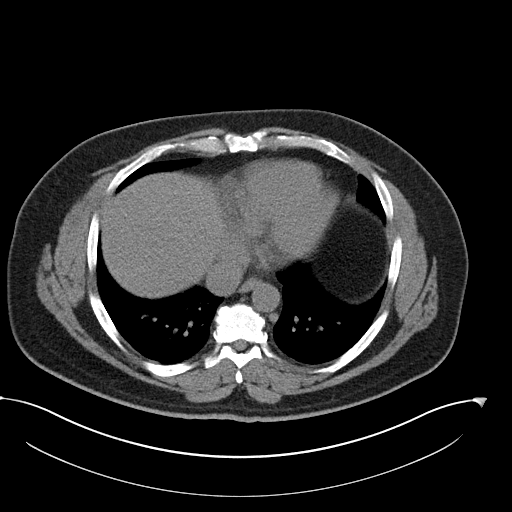
[im 95/100  soft-tissue]
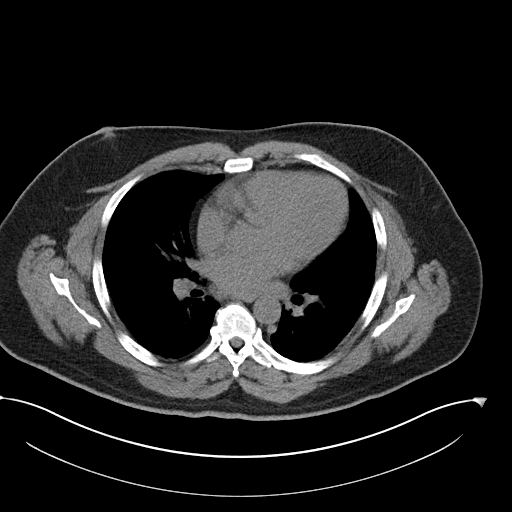

[Series 5: coronal soft tissue · coronal · 0.95mm/px · 3 of 80 slices shown]
[im 27/80  soft-tissue]
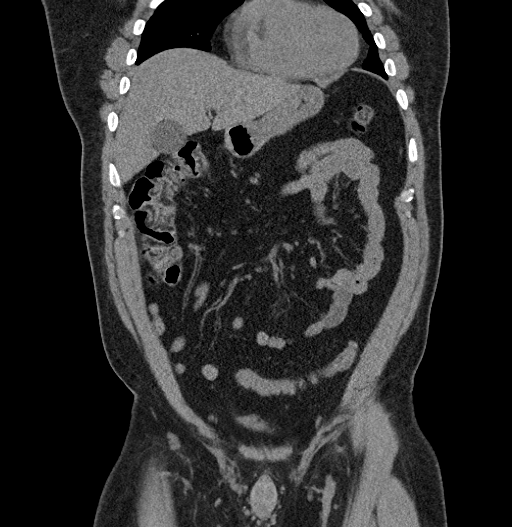
[im 36/80  soft-tissue]
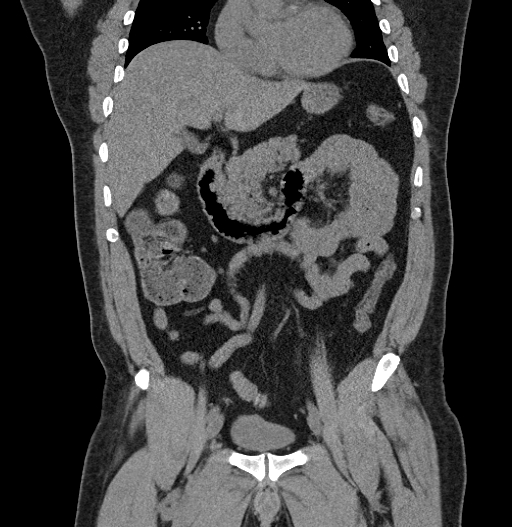
[im 44/80  soft-tissue]
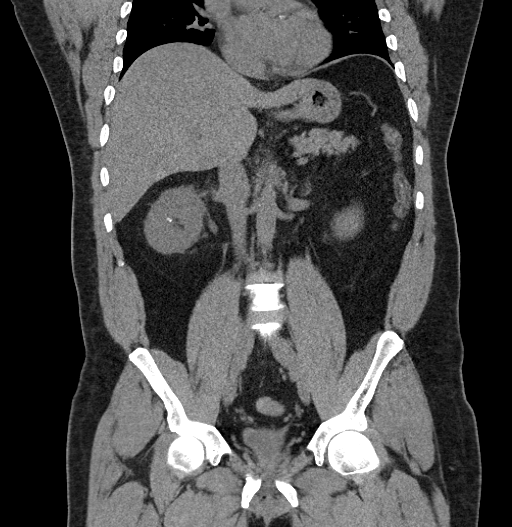

[17 of 46 positions shown; findings below may reference images not displayed]

FINDINGS: Multiple tiny less than 5 mm intrarenal calculi are seen
bilaterally. Mild right hydronephrosis is seen, with a 5 mm calculus
at the right ureteropelvic junction. No distal ureteral calculi
visualized. Subparagraph noncontrast images of the head abdominal
parenchymal organs are unremarkable as well as the gallbladder. No
soft tissue mass or inflammatory process identified. No evidence of
dilated bowel loops or hernia.
IMPRESSION: 5 mm calculus at the right ureteropelvic junction, causing mild
hydronephrosis.

Bilateral nephrolithiasis.

## 2018-08-23 MED FILL — ATORVASTATIN 40 MG TABLET: 40 | 90 days supply | Qty: 90 | Fill #0

## 2019-08-11 ENCOUNTER — Other Ambulatory Visit (HOSPITAL_COMMUNITY): Payer: Self-pay | Admitting: Internal Medicine

## 2020-01-13 ENCOUNTER — Other Ambulatory Visit (HOSPITAL_COMMUNITY): Payer: Self-pay | Admitting: Internal Medicine

## 2020-01-23 ENCOUNTER — Ambulatory Visit: Payer: PRIVATE HEALTH INSURANCE | Attending: Internal Medicine

## 2020-01-23 DIAGNOSIS — Z23 Encounter for immunization: Secondary | ICD-10-CM

## 2020-01-23 NOTE — Progress Notes (Signed)
   Covid-19 Vaccination Clinic  Name:  William Kaiser    MRN: [id number] DOB: 16-Feb-1966  01/23/2020  [name] was observed post Covid-19 immunization for 15 minutes without incident. He was provided with Vaccine Information Sheet and instruction to access the V-Safe system.   [name] was instructed to call 911 with any severe reactions post vaccine: Marland Kitchen Difficulty breathing  . Swelling of face and throat  . A fast heartbeat  . A bad rash all over body  . Dizziness and weakness

## 2020-04-17 ENCOUNTER — Other Ambulatory Visit (HOSPITAL_COMMUNITY): Payer: Self-pay | Admitting: Urology

## 2020-05-09 ENCOUNTER — Other Ambulatory Visit (HOSPITAL_COMMUNITY): Payer: Self-pay | Admitting: Internal Medicine

## 2020-08-27 ENCOUNTER — Other Ambulatory Visit (HOSPITAL_COMMUNITY): Payer: Self-pay

## 2020-08-27 MED ORDER — ATORVASTATIN CALCIUM 40 MG PO TABS
1.0000 | ORAL_TABLET | Freq: Every day | ORAL | 0 refills | Status: DC
  Filled 2020-08-27: qty 90, 90d supply, fill #0

## 2020-08-27 MED FILL — Losartan Potassium Tab 50 MG: ORAL | 90 days supply | Qty: 90 | Fill #0 | Status: AC

## 2020-09-27 ENCOUNTER — Other Ambulatory Visit (HOSPITAL_COMMUNITY): Payer: Self-pay

## 2020-12-25 ENCOUNTER — Other Ambulatory Visit (HOSPITAL_COMMUNITY): Payer: Self-pay

## 2020-12-25 MED FILL — Losartan Potassium Tab 50 MG: ORAL | 90 days supply | Qty: 90 | Fill #1 | Status: CN

## 2020-12-26 ENCOUNTER — Other Ambulatory Visit (HOSPITAL_COMMUNITY): Payer: Self-pay

## 2020-12-26 MED ORDER — ATORVASTATIN CALCIUM 40 MG PO TABS
40.0000 mg | ORAL_TABLET | Freq: Every day | ORAL | 3 refills | Status: DC
  Filled 2020-12-26: qty 90, 90d supply, fill #0
  Filled 2021-04-23: qty 90, 90d supply, fill #1
  Filled 2021-09-19: qty 90, 90d supply, fill #2

## 2021-01-18 ENCOUNTER — Other Ambulatory Visit (HOSPITAL_COMMUNITY): Payer: Self-pay

## 2021-01-18 MED FILL — Losartan Potassium Tab 50 MG: ORAL | 90 days supply | Qty: 90 | Fill #1 | Status: AC

## 2021-01-21 ENCOUNTER — Other Ambulatory Visit (HOSPITAL_COMMUNITY): Payer: Self-pay

## 2021-01-24 ENCOUNTER — Other Ambulatory Visit (HOSPITAL_COMMUNITY): Payer: Self-pay

## 2021-01-24 MED ORDER — LEVOFLOXACIN 750 MG PO TABS
750.0000 mg | ORAL_TABLET | ORAL | 0 refills | Status: DC
  Filled 2021-01-24: qty 1, 1d supply, fill #0

## 2021-04-01 NOTE — Progress Notes (Signed)
GU Location of Tumor / Histology: Prostate Ca  If Prostate Cancer, Gleason Score is (3 + 3) and PSA is (4.5 as of 01/15/2021)  Biopsies: Dr. Abner Greenspan     Past/Anticipated interventions by urology, if any:    Weight changes, if any:  No   IPSS:   3 SHIM:  21  Bowel/Bladder complaints, if any:  No bowel or bladder issues.  Nausea/Vomiting, if any:  No  Pain issues, if any:  2/10  SAFETY ISSUES: Prior radiation?  No Pacemaker/ICD?  No Possible current pregnancy?  Male Is the patient on methotrexate?  No  Current Complaints / other details:  Need more information on treatment options.

## 2021-04-08 DIAGNOSIS — C61 Malignant neoplasm of prostate: Secondary | ICD-10-CM | POA: Insufficient documentation

## 2021-04-08 NOTE — Progress Notes (Signed)
Radiation Oncology         [phone] ________________________________  Initial Outpatient Consultation  Name: William Kaiser MRN: [id number]  Date: 04/11/2021  DOB: 1965-09-18  [name], No Pcp Per (Inactive)  William Lima, MD   REFERRING PHYSICIAN: Janith [place], MD  DIAGNOSIS: 56 y.o. gentleman with Stage T1c adenocarcinoma of the prostate with Gleason score of 3+3, and PSA of 4.5.    ICD-10-CM   1. Malignant neoplasm of prostate ([place])  C61       HISTORY OF PRESENT ILLNESS: William Kaiser is a 56 y.o. male with a diagnosis of prostate cancer. He was noted to have an elevated PSA of 4.03 by his primary care physician, Dr. Seward Kaiser on 01/06/20 with a brother diagnosed with prostate cancer in his 39's.  Accordingly, he was referred for evaluation in urology by Dr. Abner Kaiser on 04/17/20.  TRUS with biopsy was discussed and recommended.  The patient elected for PSA follow-up, and PSA at [facility] on 10/09/20 was 3.24.  PSA at [facility] on 01/15/21 was higher at 4.5.   The patient proceeded to transrectal ultrasound with 12 biopsies of the prostate on 03/05/21.  The prostate volume measured 44.92 cc.  Out of 12 core biopsies, 6 were positive.  The maximum Gleason score was 3+3, and this was seen in the left lateral base, right base, right lateral base, left mid, right mid and right lateral apex.  The patient reviewed the biopsy results with his urologist and he has kindly been referred today for discussion of potential radiation treatment options.   PREVIOUS RADIATION THERAPY: No  PAST MEDICAL HISTORY:  Past Medical History:  Diagnosis Date   Diabetes mellitus without complication ([place])    ORAL MED - NEW DIAGNOSIS   History of kidney stones    RIGHT SIDE - CAUSING SEVERE PAIN - PREVIOUS STONE/ SURG      PAST SURGICAL HISTORY: Past Surgical History:  Procedure Laterality Date   CYSTOSCOPY WITH RETROGRADE PYELOGRAM, URETEROSCOPY AND STENT PLACEMENT Right 11/18/2012    Procedure: CYSTOSCOPY WITH RETROGRADE PYELOGRAM, URETEROSCOPY AND STONE EXTRACTION AND right DOUBLE J STENT PLACEMENT;  Surgeon: William Gallo, MD;  Location: [facility];  Service: Urology;  Laterality: Right;   CYSTOSCOPY/RETROGRADE/URETEROSCOPY/STONE EXTRACTION WITH BASKET     HOLMIUM LASER APPLICATION Right 7/82/9562   Procedure: HOLMIUM LASER APPLICATION;  Surgeon: William Gallo, MD;  Location: [facility];  Service: Urology;  Laterality: Right;    FAMILY HISTORY: No family history on file.  SOCIAL HISTORY:  Social History   Socioeconomic History   Marital status: Married    Spouse name: Not on file   Number of children: Not on file   Years of education: Not on file   Highest education level: Not on file  Occupational History   Not on file  Tobacco Use   Smoking status: Never   Smokeless tobacco: Never  Substance and Sexual Activity   Alcohol use: Yes    Comment: OCCAS   Drug use: No   Sexual activity: Not on file  Other Topics Concern   Not on file  Social History Narrative   Not on file   Social Determinants of Health   Financial Resource Strain: Not on file  Food Insecurity: Not on file  Transportation Needs: Not on file  Physical Activity: Not on file  Stress: Not on file  Social Connections: Not on file  Intimate Partner Violence: Not on file    ALLERGIES: Patient has no known allergies.  MEDICATIONS:  Current Outpatient Medications  Medication Sig Dispense Refill   atorvastatin (LIPITOR) 40 MG tablet Take 40 mg by mouth every evening.      calcium carbonate (OS-CAL) 1250 (500 Ca) MG chewable tablet Chew 1 tablet by mouth daily.     losartan (COZAAR) 50 MG tablet TAKE 1 TABLET BY MOUTH ONCE A DAY 90 tablet 3   metFORMIN (GLUCOPHAGE) 500 MG tablet Take 500 mg by mouth 2 (two) times daily with a meal.     atorvastatin (LIPITOR) 40 MG tablet TAKE 1 TABLET BY MOUTH ONCE A DAY 90 tablet 3   atorvastatin (LIPITOR) 40 MG tablet TAKE 1 TABLET BY MOUTH ONCE A DAY  (Patient not taking: Reported on 04/11/2021) 90 tablet 0   atorvastatin (LIPITOR) 40 MG tablet Take 1 tablet (40 mg total) by mouth daily. 90 tablet 3   ciprofloxacin (CIPRO) 250 MG tablet Take 1 tablet (250 mg total) by mouth 2 (two) times daily. 10 tablet 0   HYDROmorphone (DILAUDID) 4 MG tablet Take 4 mg by mouth every 4 (four) hours as needed for pain. (Patient not taking: Reported on 04/11/2021)     ibuprofen (ADVIL,MOTRIN) 800 MG tablet Take 1 tablet (800 mg total) by mouth 3 (three) times daily. 21 tablet 0   levofloxacin (LEVAQUIN) 750 MG tablet TAKE 1 TABLET BY MOUTH 1 HOUR PRIOR TO BIOPSY (Patient not taking: Reported on 04/11/2021) 1 tablet 0   levofloxacin (LEVAQUIN) 750 MG tablet Take 1 tablet (750 mg total) by mouth as directed one hour before biopsy (Patient not taking: Reported on 04/11/2021) 1 tablet 0   losartan (COZAAR) 50 MG tablet Take 1 tablet by mouth daily.     metFORMIN (GLUCOPHAGE) 500 MG tablet TAKE ONE (1) TABLET BY MOUTH TWO (2) TIMES DAILY 180 tablet 3   Meth-Hyo-M Bl-Na Phos-Ph Sal (URIBEL) 118 MG CAPS Take 1 capsule (118 mg total) by mouth every 8 (eight) hours as needed. (Patient not taking: Reported on 04/11/2021) 20 capsule 1   Multiple Vitamin (MULTIVITAMIN ADULT PO) 1 tablet     oxyCODONE-Acetaminophen ER 7.5-325 MG TBCR Take 1 tablet by mouth 2 (two) times daily. (Patient not taking: Reported on 04/11/2021)     No current facility-administered medications for this encounter.    REVIEW OF SYSTEMS:  On review of systems, the patient reports that he is doing well overall. He denies any chest pain, shortness of breath, cough, fevers, chills, night sweats, unintended weight changes. He denies any bowel disturbances, and denies abdominal pain, nausea or vomiting. He denies any new musculoskeletal or joint aches or pains. His IPSS was Total Score: 3, indicating mild urinary symptoms. His SHIM: 21, indicating he does not have significant erectile dysfunction. A complete review  of systems is obtained and is otherwise negative.   PHYSICAL EXAM:  Wt Readings from Last 3 Encounters:  04/11/21 219 lb 8 oz (99.6 kg)  11/17/12 212 lb (96.2 kg)   Temp Readings from Last 3 Encounters:  04/11/21 (!) 96.7 F (35.9 C) (Temporal)  11/18/12 97.9 F (36.6 C) (Oral)  11/17/12 97.1 F (36.2 C) (Oral)   BP Readings from Last 3 Encounters:  04/11/21 (!) 147/89  11/18/12 (!) 146/94  11/17/12 126/85   Pulse Readings from Last 3 Encounters:  04/11/21 96  11/18/12 86  11/17/12 (!) 102   Pain Assessment Pain Score: 2  Pain Loc: Leg (right)/10  In general this is a well appearing gentleman in no acute distress. He's alert and oriented x4  and appropriate throughout the examination. Cardiopulmonary assessment is negative for acute distress, and he exhibits normal effort.    KPS = 100  100 - Normal; no complaints; no evidence of disease. 90   - Able to carry on normal activity; minor signs or symptoms of disease. 80   - Normal activity with effort; some signs or symptoms of disease. 36   - Cares for self; unable to carry on normal activity or to do active work. 60   - Requires occasional assistance, but is able to care for most of his personal needs. 50   - Requires considerable assistance and frequent medical care. 55   - Disabled; requires special care and assistance. 52   - Severely disabled; hospital admission is indicated although death not imminent. 20   - Very sick; hospital admission necessary; active supportive treatment necessary. 10   - Moribund; fatal processes progressing rapidly. 0     - Dead  Karnofsky DA, Abelmann [place], Craver LS and Burchenal [facility] [phone]) The use of the nitrogen mustards in the palliative treatment of carcinoma: with particular reference to bronchogenic carcinoma Cancer 1 634-56  LABORATORY DATA:  Lab Results  Component Value Date   WBC 8.5 11/07/2012   HGB 11.6 (L) 11/07/2012   HCT 34.0 (L) 11/07/2012   MCV 88.2 11/07/2012   PLT 225  11/07/2012   Lab Results  Component Value Date   NA 140 11/07/2012   K 3.5 11/07/2012   CL 107 11/07/2012   No results found for: ALT, AST, GGT, ALKPHOS, BILITOT   RADIOGRAPHY: No results found.    IMPRESSION/PLAN: 1. 56 y.o. gentleman with Stage T1c adenocarcinoma of the prostate with Gleason score of 3+3, and PSA of 4.5.  We discussed the patient's workup and outlined the nature of prostate cancer in this setting. The patient's T stage, Gleason's score, and PSA put him into the Favorable risk group. Accordingly, he is eligible for a variety of potential treatment options including active surveillance, brachytherapy, 5.5 weeks of external radiation, or prostatectomy. We discussed the available radiation techniques, and focused on the details and logistics of delivery.  We discussed and outlined the risks, benefits, short and long-term effects associated with radiotherapy and compared and contrasted these with prostatectomy. We discussed the role of SpaceOAR gel in reducing the rectal toxicity associated with radiotherapy.  He appears to have a good understanding of his disease and our treatment recommendations which are of curative intent.  He was encouraged to ask questions that were answered to his stated satisfaction.  At the conclusion of our conversation, the patient is interested in moving forward with robotic assisted laparoscopic radical prostatectomy.  We personally spent 60 minutes in this encounter including chart review, reviewing radiological studies, meeting face-to-face with the patient, entering orders and completing documentation.      Tyler Pita, MD  [facility]   Radiation Oncology Direct Dial: [phone]   Fax: [phone] [email or web address]   Skype   LinkedIn

## 2021-04-11 ENCOUNTER — Ambulatory Visit
Admission: RE | Admit: 2021-04-11 | Discharge: 2021-04-11 | Disposition: A | Discharge status: Home / Self Care | Payer: No Typology Code available for payment source | Source: Ambulatory Visit | Attending: Radiation Oncology | Admitting: Radiation Oncology

## 2021-04-11 ENCOUNTER — Other Ambulatory Visit: Payer: Self-pay

## 2021-04-11 VITALS — BP 147/89 | HR 96 | Temp 96.7°F | Resp 18 | Ht 66.0 in | Wt 219.5 lb

## 2021-04-11 DIAGNOSIS — Z87442 Personal history of urinary calculi: Secondary | ICD-10-CM | POA: Diagnosis not present

## 2021-04-11 DIAGNOSIS — Z7984 Long term (current) use of oral hypoglycemic drugs: Secondary | ICD-10-CM | POA: Diagnosis not present

## 2021-04-11 DIAGNOSIS — Z791 Long term (current) use of non-steroidal anti-inflammatories (NSAID): Secondary | ICD-10-CM | POA: Diagnosis not present

## 2021-04-11 DIAGNOSIS — C61 Malignant neoplasm of prostate: Secondary | ICD-10-CM | POA: Insufficient documentation

## 2021-04-11 DIAGNOSIS — Z79899 Other long term (current) drug therapy: Secondary | ICD-10-CM | POA: Insufficient documentation

## 2021-04-11 DIAGNOSIS — E119 Type 2 diabetes mellitus without complications: Secondary | ICD-10-CM | POA: Insufficient documentation

## 2021-04-11 NOTE — Progress Notes (Signed)
Introduced myself to the patient, and his wife William Kaiser, as the prostate nurse navigator.  No barriers to care identified at this time.  He is here to discuss his radiation treatment options.  He does have a surgical consult on 1/25 as well.  I gave him my business card and asked him to call me with questions or concerns.  Verbalized understanding.

## 2021-04-15 ENCOUNTER — Other Ambulatory Visit: Payer: Self-pay | Admitting: Urology

## 2021-04-23 ENCOUNTER — Other Ambulatory Visit (HOSPITAL_COMMUNITY): Payer: Self-pay

## 2021-04-23 MED ORDER — ALPRAZOLAM 0.25 MG PO TABS
0.2500 mg | ORAL_TABLET | Freq: Every day | ORAL | 0 refills | Status: AC | PRN
  Filled 2021-04-23: qty 15, 15d supply, fill #0

## 2021-04-23 MED FILL — Losartan Potassium Tab 50 MG: ORAL | 90 days supply | Qty: 90 | Fill #0 | Status: AC

## 2021-05-03 NOTE — Patient Instructions (Addendum)
DUE TO COVID-19 ONLY ONE VISITOR IS ALLOWED TO COME WITH YOU AND STAY IN THE WAITING ROOM ONLY DURING PRE OP AND PROCEDURE.   **NO VISITORS ARE ALLOWED IN THE SHORT STAY AREA OR RECOVERY ROOM!!**  IF YOU WILL BE ADMITTED INTO THE HOSPITAL YOU ARE ALLOWED ONLY TWO SUPPORT PEOPLE DURING VISITATION HOURS ONLY (7 AM -8PM)   The support person(s) must pass our screening, gel in and out, and wear a mask at all times, including in the patients room. Patients must also wear a mask when staff or their support person are in the room. Visitors GUEST BADGE MUST BE WORN VISIBLY  One adult visitor may remain with you overnight and MUST be in the room by 8 P.M.  No visitors under the age of 17. Any visitor under the age of 40 must be accompanied by an adult.    COVID SWAB TESTING MUST BE COMPLETED ON:  05/22/21   Site: [facility] [place] [facility] [place] Enter: Main Entrance have a seat in the waiting area to the right of main entrance (DO NOT [place]!!!!!) Dial: [phone] to alert staff you have arrived  You are not required to quarantine, however you are required to wear a well-fitted mask when you are out and around people not in your household.  Hand Hygiene often Do NOT share personal items Notify your provider if you are in close contact with someone who has COVID or you develop fever 100.4 or greater, new onset of sneezing, cough, sore throat, shortness of breath or body aches.  [place], [address], must go inside of the hospital, NOT A DRIVE THRU!  (Must self quarantine after testing. Follow instructions on handout.)       Your procedure is scheduled on: 05/24/21   Report to [facility] Main Entrance    Report to short stay at: 5:15 AM   Call this number if you have problems the morning of surgery [phone]  CLEAR LIQUID DIET: [place].  Foods Allowed                                                                      Foods Excluded  Water, Black Coffee and tea, regular and decaf                             liquids that you cannot  Plain Jell-O in any flavor  (No red)                                           see through such as: Fruit ices (not with fruit pulp)                                     milk, soups, orange juice              Iced Popsicles (No red)  All solid food                                   Apple juices Sports drinks like Gatorade (No red) Lightly seasoned clear broth or consume(fat free) Sugar Sample Menu Breakfast                                Lunch                                     Supper Cranberry juice                    Beef broth                            Chicken broth Jell-O                                     Grape juice                           Apple juice Coffee or tea                        Jell-O                                      Popsicle                                                Coffee or tea                        Coffee or tea    FOLLOW BOWEL PREP AND ANY ADDITIONAL PRE OP INSTRUCTIONS YOU RECEIVED FROM YOUR SURGEON'S OFFICE!!!  Dissolve 17 grams in 4 ounces of water AND DRINK AT NOON DAY BEFORE SURGERY    USE ONE FLEET ENEMA NIGHT BEFORE SURGERY  Oral Hygiene is also important to reduce your risk of infection.                                    Remember - BRUSH YOUR TEETH THE MORNING OF SURGERY WITH YOUR REGULAR TOOTHPASTE   Do NOT smoke after Midnight   Take these medicines the morning of surgery with A SIP OF WATER:   DO NOT TAKE ANY ORAL DIABETIC MEDICATIONS DAY OF YOUR SURGERY                              You may not have any metal on your body including hair pins, jewelry, and body piercing             Do not wear make-up, lotions, powders, perfumes/cologne, or deodorant  Do not wear nail polish including gel and S&S, artificial/acrylic  nails, or  any other type of covering on natural nails including finger and toenails. If you have artificial nails, gel coating, etc. that needs to be removed by a nail salon please have this removed prior to surgery or surgery may need to be canceled/ delayed if the surgeon/ anesthesia feels like they are unable to be safely monitored.   Do not shave  48 hours prior to surgery.               Men may shave face and neck.   Do not bring valuables to the hospital. [place].   Contacts, dentures or bridgework may not be worn into surgery.   Bring small overnight bag day of surgery.    Patients discharged on the day of surgery will not be allowed to drive home.  Someone needs to stay with you for the first 24 hours after anesthesia.   Special Instructions: Bring a copy of your healthcare power of attorney and living will documents         the day of surgery if you haven't scanned them before.              Please read over the following fact sheets you were given: IF YOU HAVE QUESTIONS ABOUT YOUR PRE-OP INSTRUCTIONS PLEASE CALL [phone]     [facility] - Preparing for Surgery Before surgery, you can play an important role.  Because skin is not sterile, your skin needs to be as free of germs as possible.  You can reduce the number of germs on your skin by washing with CHG (chlorahexidine gluconate) soap before surgery.  CHG is an antiseptic cleaner which kills germs and bonds with the skin to continue killing germs even after washing. Please DO NOT use if you have an allergy to CHG or antibacterial soaps.  If your skin becomes reddened/irritated stop using the CHG and inform your nurse when you arrive at Short Stay. Do not shave (including legs and underarms) for at least 48 hours prior to the first CHG shower.  You may shave your face/neck. Please follow these instructions carefully:  1.  Shower with CHG Soap the night before surgery and the  morning of  Surgery.  2.  If you choose to wash your hair, wash your hair first as usual with your  normal  shampoo.  3.  After you shampoo, rinse your hair and body thoroughly to remove the  shampoo.                           4.  Use CHG as you would any other liquid soap.  You can apply chg directly  to the skin and wash                       Gently with a scrungie or clean washcloth.  5.  Apply the CHG Soap to your body ONLY FROM THE NECK DOWN.   Do not use on face/ open                           Wound or open sores. Avoid contact with eyes, ears mouth and genitals (private parts).  Wash face,  Genitals (private parts) with your normal soap.             6.  Wash thoroughly, paying special attention to the area where your surgery  will be performed.  7.  Thoroughly rinse your body with warm water from the neck down.  8.  DO NOT shower/wash with your normal soap after using and rinsing off  the CHG Soap.                9.  Pat yourself dry with a clean towel.            10.  Wear clean pajamas.            11.  Place clean sheets on your bed the night of your first shower and do not  sleep with pets. Day of Surgery : Do not apply any lotions/deodorants the morning of surgery.  Please wear clean clothes to the hospital/surgery center.  FAILURE TO FOLLOW THESE INSTRUCTIONS MAY RESULT IN THE CANCELLATION OF YOUR SURGERY PATIENT SIGNATURE_________________________________  NURSE SIGNATURE__________________________________  ________________________________________________________________________   Adam [place]  An incentive spirometer is a tool that can help keep your lungs clear and active. This tool measures how well you are filling your lungs with each breath. Taking long deep breaths may help reverse or decrease the chance of developing breathing (pulmonary) problems (especially infection) following: A long period of time when you are unable to move or be active. BEFORE THE  PROCEDURE  If the spirometer includes an indicator to show your best effort, your nurse or respiratory therapist will set it to a desired goal. If possible, sit up straight or lean slightly forward. Try not to slouch. Hold the incentive spirometer in an upright position. INSTRUCTIONS FOR USE  Sit on the edge of your bed if possible, or sit up as far as you can in bed or on a chair. Hold the incentive spirometer in an upright position. Breathe out normally. Place the mouthpiece in your mouth and seal your lips tightly around it. Breathe in slowly and as deeply as possible, raising the piston or the ball toward the top of the column. Hold your breath for 3-5 seconds or for as long as possible. Allow the piston or ball to fall to the bottom of the column. Remove the mouthpiece from your mouth and breathe out normally. Rest for a few seconds and repeat Steps 1 through 7 at least 10 times every 1-2 hours when you are awake. Take your time and take a few normal breaths between deep breaths. The spirometer may include an indicator to show your best effort. Use the indicator as a goal to work toward during each repetition. After each set of 10 deep breaths, practice coughing to be sure your lungs are clear. If you have an incision (the cut made at the time of surgery), support your incision when coughing by placing a pillow or rolled up towels firmly against it. Once you are able to get out of bed, walk around indoors and cough well. You may stop using the incentive spirometer when instructed by your caregiver.  RISKS AND COMPLICATIONS Take your time so you do not get dizzy or light-headed. If you are in pain, you may need to take or ask for pain medication before doing incentive spirometry. It is harder to take a deep breath if you are having pain. AFTER USE Rest and breathe slowly and easily. It can be helpful to keep track of  a log of your progress. Your caregiver can provide you with a simple table  to help with this. If you are using the spirometer at home, follow these instructions: [place] IF:  You are having difficultly using the spirometer. You have trouble using the spirometer as often as instructed. Your pain medication is not giving enough relief while using the spirometer. You develop fever of 100.5 F (38.1 C) or higher. SEEK IMMEDIATE MEDICAL CARE IF:  You cough up bloody sputum that had not been present before. You develop fever of 102 F (38.9 C) or greater. You develop worsening pain at or near the incision site. MAKE SURE YOU:  Understand these instructions. Will watch your condition. Will get help right away if you are not doing well or get worse. Document Released: 07/21/2006 Document Revised: 06/02/2011 Document Reviewed: 09/21/2006 [facility] Patient Information 2014 [place], [place].   ________________________________________________________________________

## 2021-05-07 ENCOUNTER — Encounter (HOSPITAL_COMMUNITY)
Admission: RE | Admit: 2021-05-07 | Discharge: 2021-05-07 | Disposition: A | Discharge status: Home / Self Care | Payer: No Typology Code available for payment source | Source: Ambulatory Visit | Attending: Urology | Admitting: Urology

## 2021-05-07 ENCOUNTER — Encounter (HOSPITAL_COMMUNITY): Payer: Self-pay

## 2021-05-07 ENCOUNTER — Other Ambulatory Visit: Payer: Self-pay

## 2021-05-07 VITALS — BP 143/89 | HR 84 | Temp 97.9°F | Resp 18 | Ht 66.0 in | Wt 221.4 lb

## 2021-05-07 DIAGNOSIS — C61 Malignant neoplasm of prostate: Secondary | ICD-10-CM | POA: Insufficient documentation

## 2021-05-07 DIAGNOSIS — Z01818 Encounter for other preprocedural examination: Secondary | ICD-10-CM | POA: Diagnosis present

## 2021-05-07 DIAGNOSIS — R7303 Prediabetes: Secondary | ICD-10-CM | POA: Insufficient documentation

## 2021-05-07 HISTORY — DX: Malignant (primary) neoplasm, unspecified: C80.1

## 2021-05-07 HISTORY — DX: Essential (primary) hypertension: I10

## 2021-05-07 LAB — BASIC METABOLIC PANEL
Anion gap: 8 (ref 5–15)
BUN: 13 mg/dL (ref 6–20)
CO2: 22 mmol/L (ref 22–32)
Calcium: 9.1 mg/dL (ref 8.9–10.3)
Chloride: 105 mmol/L (ref 98–111)
Creatinine, Ser: 1 mg/dL (ref 0.61–1.24)
GFR, Estimated: >60 mL/min (ref >60)
Glucose, Bld: 107 mg/dL — ABNORMAL HIGH (ref 70–99)
Potassium: 3.8 mmol/L (ref 3.5–5.1)
Sodium: 135 mmol/L (ref 135–145)

## 2021-05-07 LAB — CBC
HCT: 36.4 % — ABNORMAL LOW (ref 39.0–52.0)
Hemoglobin: 12.2 g/dL — ABNORMAL LOW (ref 13.0–17.0)
MCH: 31.5 pg (ref 26.0–34.0)
MCHC: 33.5 g/dL (ref 30.0–36.0)
MCV: 94.1 fL (ref 80.0–100.0)
Platelets: 242 10*3/uL (ref 150–400)
RBC: 3.87 MIL/uL — ABNORMAL LOW (ref 4.22–5.81)
RDW: 12.9 % (ref 11.5–15.5)
WBC: 5.3 10*3/uL (ref 4.0–10.5)
nRBC: 0 % (ref 0.0–0.2)

## 2021-05-07 LAB — HEMOGLOBIN A1C
Hgb A1c MFr Bld: 5.7 % — ABNORMAL HIGH (ref 4.8–5.6)
Mean Plasma Glucose: 116.89 mg/dL

## 2021-05-07 LAB — GLUCOSE, CAPILLARY: Glucose-Capillary: 110 mg/dL — ABNORMAL HIGH (ref 70–99)

## 2021-05-07 NOTE — Progress Notes (Addendum)
COVID Vaccine Completed: Yes Date COVID Vaccine completed: 2022 x 4 COVID vaccine manufacturer: [place] Test: 05/22/21 Bowel prep reminder: Reviewed  PCP - Dr. Seward Carol Cardiologist -   Chest x-ray -  EKG -  Stress Test -  ECHO -  Cardiac Cath -  Pacemaker/ICD device last checked:  Sleep Study -  CPAP -   Fasting Blood Sugar - N/A Checks Blood Sugar ___0__ times a day  Blood Thinner Instructions: Aspirin Instructions: Last Dose:  Anesthesia review: Hx: HTN,DIA  Patient denies shortness of breath, fever, cough and chest pain at PAT appointment   Patient verbalized understanding of instructions that were given to them at the PAT appointment. Patient was also instructed that they will need to review over the PAT instructions again at home before surgery.

## 2021-05-22 ENCOUNTER — Other Ambulatory Visit (HOSPITAL_COMMUNITY): Payer: No Typology Code available for payment source

## 2021-05-22 ENCOUNTER — Other Ambulatory Visit: Payer: Self-pay

## 2021-05-22 ENCOUNTER — Encounter (HOSPITAL_COMMUNITY)
Admission: RE | Admit: 2021-05-22 | Discharge: 2021-05-22 | Disposition: A | Discharge status: Home / Self Care | Payer: No Typology Code available for payment source | Source: Ambulatory Visit | Attending: Urology | Admitting: Urology

## 2021-05-22 DIAGNOSIS — Z20822 Contact with and (suspected) exposure to covid-19: Secondary | ICD-10-CM | POA: Diagnosis not present

## 2021-05-22 DIAGNOSIS — Z01812 Encounter for preprocedural laboratory examination: Secondary | ICD-10-CM | POA: Diagnosis present

## 2021-05-22 DIAGNOSIS — Z01818 Encounter for other preprocedural examination: Secondary | ICD-10-CM

## 2021-05-22 LAB — SARS CORONAVIRUS 2 (TAT 6-24 HRS): SARS Coronavirus 2: NEGATIVE

## 2021-05-23 NOTE — Anesthesia Preprocedure Evaluation (Addendum)
Anesthesia Evaluation  ?Patient identified by MRN, date of birth, ID band ?Patient awake ? ? ? ?Reviewed: ?Allergy & Precautions, NPO status , Patient's Chart, lab work & pertinent test results ? ?Airway ?Mallampati: I ? ?TM Distance: >3 FB ?Neck ROM: Full ? ? ? Dental ?no notable dental hx. ? ?  ?Pulmonary ? ?  ?Pulmonary exam normal ? ? ? ? ? ? ? Cardiovascular ?hypertension, Pt. on medications ?Normal cardiovascular exam ? ? ?  ?Neuro/Psych ?  ? GI/Hepatic ?  ?Endo/Other  ?diabetes ? Renal/GU ?  ? ?  ?Musculoskeletal ? ? Abdominal ?(+) + obese,   ?Peds ? Hematology ?  ?Anesthesia Other Findings ? ? Reproductive/Obstetrics ? ?  ? ? ? ? ? ? ? ? ? ? ? ? ? ?  ?  ? ? ? ? ? ? ?Anesthesia Physical ?Anesthesia Plan ? ?ASA: 2 ? ?Anesthesia Plan: General  ? ?Post-op Pain Management: Dilaudid IV  ? ?Induction: Intravenous ? ?PONV Risk Score and Plan: 4 or greater and Ondansetron, Midazolam, Dexamethasone and Treatment may vary due to age or medical condition ? ?Airway Management Planned: Oral ETT ? ?Additional Equipment: None ? ?Intra-op Plan:  ? ?Post-operative Plan: Extubation in OR ? ?Informed Consent: I have reviewed the patients History and Physical, chart, labs and discussed the procedure including the risks, benefits and alternatives for the proposed anesthesia with the patient or authorized representative who has indicated his/her understanding and acceptance.  ? ? ? ?Dental advisory given ? ?Plan Discussed with: CRNA ? ?Anesthesia Plan Comments:   ? ? ? ? ? ?Anesthesia Quick Evaluation ? ?

## 2021-05-24 ENCOUNTER — Encounter (HOSPITAL_COMMUNITY)
Admission: RE | Disposition: A | Discharge status: Home / Self Care | Payer: Self-pay | Source: Ambulatory Visit | Attending: Urology

## 2021-05-24 ENCOUNTER — Ambulatory Visit (HOSPITAL_BASED_OUTPATIENT_CLINIC_OR_DEPARTMENT_OTHER): Payer: No Typology Code available for payment source | Admitting: Anesthesiology

## 2021-05-24 ENCOUNTER — Encounter (HOSPITAL_COMMUNITY): Payer: Self-pay | Admitting: Urology

## 2021-05-24 ENCOUNTER — Other Ambulatory Visit: Payer: Self-pay

## 2021-05-24 ENCOUNTER — Other Ambulatory Visit (HOSPITAL_COMMUNITY): Payer: Self-pay

## 2021-05-24 ENCOUNTER — Observation Stay (HOSPITAL_COMMUNITY)
Admission: RE | Admit: 2021-05-24 | Discharge: 2021-05-25 | Disposition: A | Discharge status: Home / Self Care | Payer: No Typology Code available for payment source | Source: Ambulatory Visit | Attending: Urology | Admitting: Urology

## 2021-05-24 ENCOUNTER — Ambulatory Visit (HOSPITAL_COMMUNITY): Payer: No Typology Code available for payment source | Admitting: Anesthesiology

## 2021-05-24 DIAGNOSIS — Z79899 Other long term (current) drug therapy: Secondary | ICD-10-CM | POA: Diagnosis not present

## 2021-05-24 DIAGNOSIS — Z7984 Long term (current) use of oral hypoglycemic drugs: Secondary | ICD-10-CM | POA: Insufficient documentation

## 2021-05-24 DIAGNOSIS — I10 Essential (primary) hypertension: Secondary | ICD-10-CM | POA: Diagnosis not present

## 2021-05-24 DIAGNOSIS — E119 Type 2 diabetes mellitus without complications: Secondary | ICD-10-CM | POA: Diagnosis not present

## 2021-05-24 DIAGNOSIS — C61 Malignant neoplasm of prostate: Secondary | ICD-10-CM

## 2021-05-24 DIAGNOSIS — Z01818 Encounter for other preprocedural examination: Secondary | ICD-10-CM

## 2021-05-24 HISTORY — PX: ROBOT ASSISTED LAPAROSCOPIC RADICAL PROSTATECTOMY: SHX5141

## 2021-05-24 HISTORY — PX: LYMPHADENECTOMY: SHX5960

## 2021-05-24 LAB — HEMOGLOBIN AND HEMATOCRIT, BLOOD
HCT: 34.9 % — ABNORMAL LOW (ref 39.0–52.0)
Hemoglobin: 11.4 g/dL — ABNORMAL LOW (ref 13.0–17.0)

## 2021-05-24 LAB — GLUCOSE, CAPILLARY
Glucose-Capillary: 108 mg/dL — ABNORMAL HIGH (ref 70–99)
Glucose-Capillary: 143 mg/dL — ABNORMAL HIGH (ref 70–99)
Glucose-Capillary: 139 mg/dL — ABNORMAL HIGH (ref 70–99)
Glucose-Capillary: 130 mg/dL — ABNORMAL HIGH (ref 70–99)

## 2021-05-24 LAB — TYPE AND SCREEN
ABO/RH(D): O POS
Antibody Screen: NEGATIVE

## 2021-05-24 LAB — ABO/RH: ABO/RH(D): O POS

## 2021-05-24 SURGERY — PROSTATECTOMY, RADICAL, ROBOT-ASSISTED, LAPAROSCOPIC
Anesthesia: General | Site: Abdomen

## 2021-05-24 MED ORDER — HYDROMORPHONE HCL 1 MG/ML IJ SOLN
INTRAMUSCULAR | Status: DC | PRN
  Administered 2021-05-24 (×2): 1 mg via INTRAVENOUS

## 2021-05-24 MED ORDER — DOCUSATE SODIUM 100 MG PO CAPS: 100.0000 mg | ORAL_CAPSULE | Freq: Two times a day (BID) | ORAL | Status: AC

## 2021-05-24 MED ORDER — POLYETHYLENE GLYCOL 3350 17 G PO PACK: 17.0000 g | PACK | Freq: Every day | ORAL | Status: DC

## 2021-05-24 MED ORDER — DOCUSATE SODIUM 100 MG PO CAPS
100.0000 mg | ORAL_CAPSULE | Freq: Two times a day (BID) | ORAL | Status: DC
Start: 2021-05-24 — End: 2021-05-25
  Administered 2021-05-24 – 2021-05-25 (×2): 100 mg via ORAL
  Filled 2021-05-24 (×2): qty 1

## 2021-05-24 MED ORDER — LOSARTAN POTASSIUM 50 MG PO TABS
50.0000 mg | ORAL_TABLET | Freq: Every day | ORAL | Status: DC
  Administered 2021-05-24 – 2021-05-25 (×2): 50 mg via ORAL
  Filled 2021-05-24 (×2): qty 1

## 2021-05-24 MED ORDER — HYDROCODONE-ACETAMINOPHEN 5-325 MG PO TABS
1.0000 | ORAL_TABLET | Freq: Four times a day (QID) | ORAL | 0 refills | Status: AC | PRN
  Filled 2021-05-24: qty 20, 3d supply, fill #0

## 2021-05-24 MED ORDER — BUPIVACAINE LIPOSOME 1.3 % IJ SUSP
INTRAMUSCULAR | Status: DC | PRN
  Administered 2021-05-24: 20 mL

## 2021-05-24 MED ORDER — PROPOFOL 10 MG/ML IV BOLUS
INTRAVENOUS | Status: AC
  Filled 2021-05-24: qty 20

## 2021-05-24 MED ORDER — HYDROMORPHONE HCL 1 MG/ML IJ SOLN
0.2500 mg | INTRAMUSCULAR | Status: DC | PRN
  Administered 2021-05-24: 0.5 mg via INTRAVENOUS

## 2021-05-24 MED ORDER — SULFAMETHOXAZOLE-TRIMETHOPRIM 800-160 MG PO TABS
1.0000 | ORAL_TABLET | Freq: Two times a day (BID) | ORAL | 0 refills | Status: AC
  Filled 2021-05-24: qty 6, 3d supply, fill #0

## 2021-05-24 MED ORDER — MEPERIDINE HCL 50 MG/ML IJ SOLN
6.2500 mg | INTRAMUSCULAR | Status: DC | PRN
  Administered 2021-05-24: 6.25 mg via INTRAVENOUS

## 2021-05-24 MED ORDER — MEPERIDINE HCL 50 MG/ML IJ SOLN
INTRAMUSCULAR | Status: AC
  Filled 2021-05-24: qty 1

## 2021-05-24 MED ORDER — GLYCOPYRROLATE 0.2 MG/ML IJ SOLN
INTRAMUSCULAR | Status: AC
  Filled 2021-05-24: qty 1

## 2021-05-24 MED ORDER — KETOROLAC TROMETHAMINE 30 MG/ML IJ SOLN
INTRAMUSCULAR | Status: AC
  Filled 2021-05-24: qty 1

## 2021-05-24 MED ORDER — ROCURONIUM BROMIDE 10 MG/ML (PF) SYRINGE
PREFILLED_SYRINGE | INTRAVENOUS | Status: AC
  Filled 2021-05-24: qty 10

## 2021-05-24 MED ORDER — ORAL CARE MOUTH RINSE: 15.0000 mL | Freq: Once | OROMUCOSAL | Status: AC

## 2021-05-24 MED ORDER — 0.9 % SODIUM CHLORIDE (POUR BTL) OPTIME
TOPICAL | Status: DC | PRN
  Administered 2021-05-24: 1000 mL

## 2021-05-24 MED ORDER — DIPHENHYDRAMINE HCL 12.5 MG/5ML PO ELIX: 12.5000 mg | ORAL_SOLUTION | Freq: Four times a day (QID) | ORAL | Status: DC | PRN

## 2021-05-24 MED ORDER — DIPHENHYDRAMINE HCL 50 MG/ML IJ SOLN: 12.5000 mg | Freq: Four times a day (QID) | INTRAMUSCULAR | Status: DC | PRN

## 2021-05-24 MED ORDER — SODIUM CHLORIDE 0.9 % IV BOLUS
1000.0000 mL | Freq: Once | INTRAVENOUS | Status: AC
  Administered 2021-05-24: 1000 mL via INTRAVENOUS

## 2021-05-24 MED ORDER — FENTANYL CITRATE (PF) 100 MCG/2ML IJ SOLN
INTRAMUSCULAR | Status: DC | PRN
  Administered 2021-05-24 (×4): 50 ug via INTRAVENOUS
  Administered 2021-05-24: 100 ug via INTRAVENOUS
  Administered 2021-05-24 (×3): 50 ug via INTRAVENOUS

## 2021-05-24 MED ORDER — CHLORHEXIDINE GLUCONATE 0.12 % MT SOLN
15.0000 mL | Freq: Once | OROMUCOSAL | Status: AC
  Administered 2021-05-24: 15 mL via OROMUCOSAL

## 2021-05-24 MED ORDER — LIDOCAINE HCL (PF) 2 % IJ SOLN
INTRAMUSCULAR | Status: AC
  Filled 2021-05-24: qty 5

## 2021-05-24 MED ORDER — FENTANYL CITRATE (PF) 250 MCG/5ML IJ SOLN
INTRAMUSCULAR | Status: AC
  Filled 2021-05-24: qty 5

## 2021-05-24 MED ORDER — MIDAZOLAM HCL 2 MG/2ML IJ SOLN
INTRAMUSCULAR | Status: AC
  Filled 2021-05-24: qty 2

## 2021-05-24 MED ORDER — DEXAMETHASONE SODIUM PHOSPHATE 4 MG/ML IJ SOLN
INTRAMUSCULAR | Status: DC | PRN
  Administered 2021-05-24: 10 mg via INTRAVENOUS

## 2021-05-24 MED ORDER — MIDAZOLAM HCL 5 MG/5ML IJ SOLN
INTRAMUSCULAR | Status: DC | PRN
  Administered 2021-05-24: 2 mg via INTRAVENOUS

## 2021-05-24 MED ORDER — HYDROMORPHONE HCL 1 MG/ML IJ SOLN
INTRAMUSCULAR | Status: AC
  Filled 2021-05-24: qty 2

## 2021-05-24 MED ORDER — FENTANYL CITRATE (PF) 100 MCG/2ML IJ SOLN
INTRAMUSCULAR | Status: AC
  Filled 2021-05-24: qty 2

## 2021-05-24 MED ORDER — ONDANSETRON HCL 4 MG/2ML IJ SOLN
INTRAMUSCULAR | Status: AC
  Filled 2021-05-24: qty 2

## 2021-05-24 MED ORDER — OXYCODONE HCL 5 MG PO TABS
5.0000 mg | ORAL_TABLET | ORAL | Status: DC | PRN
  Administered 2021-05-24 – 2021-05-25 (×3): 5 mg via ORAL
  Filled 2021-05-24 (×3): qty 1

## 2021-05-24 MED ORDER — SODIUM CHLORIDE (PF) 0.9 % IJ SOLN
INTRAMUSCULAR | Status: DC | PRN
  Administered 2021-05-24: 20 mL

## 2021-05-24 MED ORDER — HYDROMORPHONE HCL 1 MG/ML IJ SOLN
0.5000 mg | INTRAMUSCULAR | Status: DC | PRN
  Administered 2021-05-24 – 2021-05-25 (×2): 1 mg via INTRAVENOUS
  Filled 2021-05-24 (×2): qty 1

## 2021-05-24 MED ORDER — ROCURONIUM BROMIDE 10 MG/ML (PF) SYRINGE
PREFILLED_SYRINGE | INTRAVENOUS | Status: DC | PRN
  Administered 2021-05-24: 100 mg via INTRAVENOUS
  Administered 2021-05-24: 20 mg via INTRAVENOUS
  Administered 2021-05-24: 10 mg via INTRAVENOUS
  Administered 2021-05-24: 30 mg via INTRAVENOUS

## 2021-05-24 MED ORDER — SUGAMMADEX SODIUM 200 MG/2ML IV SOLN
INTRAVENOUS | Status: DC | PRN
  Administered 2021-05-24: 300 mg via INTRAVENOUS

## 2021-05-24 MED ORDER — ONDANSETRON HCL 4 MG/2ML IJ SOLN: 4.0000 mg | Freq: Once | INTRAMUSCULAR | Status: DC | PRN

## 2021-05-24 MED ORDER — SODIUM CHLORIDE (PF) 0.9 % IJ SOLN
INTRAMUSCULAR | Status: AC
  Filled 2021-05-24: qty 20

## 2021-05-24 MED ORDER — FLEET ENEMA 7-19 GM/118ML RE ENEM: 1.0000 | ENEMA | Freq: Once | RECTAL | Status: DC

## 2021-05-24 MED ORDER — SODIUM CHLORIDE 0.45 % IV SOLN: INTRAVENOUS | Status: DC

## 2021-05-24 MED ORDER — HYOSCYAMINE SULFATE 0.125 MG SL SUBL
0.1250 mg | SUBLINGUAL_TABLET | SUBLINGUAL | Status: DC | PRN
Start: 2021-05-24 — End: 2021-05-25
  Filled 2021-05-24: qty 1

## 2021-05-24 MED ORDER — LACTATED RINGERS IV SOLN: INTRAVENOUS | Status: DC | PRN

## 2021-05-24 MED ORDER — BACITRACIN-NEOMYCIN-POLYMYXIN 400-5-5000 EX OINT: 1.0000 "application " | TOPICAL_OINTMENT | Freq: Three times a day (TID) | CUTANEOUS | Status: DC | PRN

## 2021-05-24 MED ORDER — BUPIVACAINE LIPOSOME 1.3 % IJ SUSP
INTRAMUSCULAR | Status: AC
  Filled 2021-05-24: qty 20

## 2021-05-24 MED ORDER — HYDROMORPHONE HCL 2 MG/ML IJ SOLN
INTRAMUSCULAR | Status: AC
  Filled 2021-05-24: qty 1

## 2021-05-24 MED ORDER — PROPOFOL 10 MG/ML IV BOLUS
INTRAVENOUS | Status: DC | PRN
  Administered 2021-05-24: 200 mg via INTRAVENOUS

## 2021-05-24 MED ORDER — ONDANSETRON HCL 4 MG/2ML IJ SOLN
INTRAMUSCULAR | Status: DC | PRN
Start: 2021-05-24 — End: 2021-05-24
  Administered 2021-05-24: 4 mg via INTRAVENOUS

## 2021-05-24 MED ORDER — LACTATED RINGERS IV SOLN: INTRAVENOUS | Status: DC

## 2021-05-24 MED ORDER — LIDOCAINE 2% (20 MG/ML) 5 ML SYRINGE
INTRAMUSCULAR | Status: DC | PRN
  Administered 2021-05-24: 60 mg via INTRAVENOUS

## 2021-05-24 MED ORDER — ATORVASTATIN CALCIUM 40 MG PO TABS
40.0000 mg | ORAL_TABLET | Freq: Every day | ORAL | Status: DC
  Administered 2021-05-24 – 2021-05-25 (×2): 40 mg via ORAL
  Filled 2021-05-24 (×2): qty 1

## 2021-05-24 MED ORDER — CEFAZOLIN SODIUM-DEXTROSE 2-4 GM/100ML-% IV SOLN
INTRAVENOUS | Status: AC
  Filled 2021-05-24: qty 100

## 2021-05-24 MED ORDER — DEXAMETHASONE SODIUM PHOSPHATE 10 MG/ML IJ SOLN
INTRAMUSCULAR | Status: AC
  Filled 2021-05-24: qty 1

## 2021-05-24 MED ORDER — ONDANSETRON HCL 4 MG/2ML IJ SOLN: 4.0000 mg | INTRAMUSCULAR | Status: DC | PRN

## 2021-05-24 MED ORDER — ACETAMINOPHEN 500 MG PO TABS
1000.0000 mg | ORAL_TABLET | Freq: Four times a day (QID) | ORAL | Status: DC
  Administered 2021-05-24 – 2021-05-25 (×3): 1000 mg via ORAL
  Filled 2021-05-24 (×3): qty 2

## 2021-05-24 MED ORDER — CEFAZOLIN SODIUM-DEXTROSE 2-4 GM/100ML-% IV SOLN
2.0000 g | INTRAVENOUS | Status: AC
Start: 2021-05-24 — End: 2021-05-24
  Administered 2021-05-24 (×2): 2 g via INTRAVENOUS
  Filled 2021-05-24: qty 100

## 2021-05-24 MED ORDER — INSULIN ASPART 100 UNIT/ML IJ SOLN: 0.0000 [IU] | Freq: Three times a day (TID) | INTRAMUSCULAR | Status: DC

## 2021-05-24 MED ORDER — SUGAMMADEX SODIUM 500 MG/5ML IV SOLN
INTRAVENOUS | Status: AC
  Filled 2021-05-24: qty 5

## 2021-05-24 MED ORDER — LACTATED RINGERS IR SOLN
Status: DC | PRN
  Administered 2021-05-24: 1000 mL

## 2021-05-24 MED ORDER — KETOROLAC TROMETHAMINE 30 MG/ML IJ SOLN
30.0000 mg | Freq: Once | INTRAMUSCULAR | Status: AC | PRN
  Administered 2021-05-24: 30 mg via INTRAVENOUS

## 2021-05-24 SURGICAL SUPPLY — 74 items
ADH SKN CLS APL DERMABOND .7 (GAUZE/BANDAGES/DRESSINGS) ×2
AGENT HMST KT MTR STRL THRMB (HEMOSTASIS) ×2
APL ESCP 34 STRL LF DISP (HEMOSTASIS) ×2
APL PRP STRL LF DISP 70% ISPRP (MISCELLANEOUS) ×2
APL SWBSTK 6 STRL LF DISP (MISCELLANEOUS) ×2
APPLICATOR COTTON TIP 6 STRL (MISCELLANEOUS) ×3 IMPLANT
APPLICATOR COTTON TIP 6IN STRL (MISCELLANEOUS) ×3
APPLICATOR SURGIFLO ENDO (HEMOSTASIS) ×1 IMPLANT
BAG COUNTER SPONGE SURGICOUNT (BAG) IMPLANT
BAG SPNG CNTER NS LX DISP (BAG)
CATH FOLEY 2WAY SLVR  5CC 18FR (CATHETERS) ×3
CATH FOLEY 2WAY SLVR 5CC 18FR (CATHETERS) ×3 IMPLANT
CATH ROBINSON RED A/P 16FR (CATHETERS) ×4 IMPLANT
CATH SILICONE 5CC 18FR (INSTRUMENTS) ×4 IMPLANT
CHLORAPREP W/TINT 26 (MISCELLANEOUS) ×4 IMPLANT
CLIP LIGATING HEM O LOK PURPLE (MISCELLANEOUS) ×9 IMPLANT
COVER SURGICAL LIGHT HANDLE (MISCELLANEOUS) ×4 IMPLANT
COVER TIP SHEARS 8 DVNC (MISCELLANEOUS) ×3 IMPLANT
COVER TIP SHEARS 8MM DA VINCI (MISCELLANEOUS) ×3
CUTTER ECHEON FLEX ENDO 45 340 (ENDOMECHANICALS) ×1 IMPLANT
DERMABOND ADVANCED (GAUZE/BANDAGES/DRESSINGS) ×1
DERMABOND ADVANCED .7 DNX12 (GAUZE/BANDAGES/DRESSINGS) ×3 IMPLANT
DRAIN CHANNEL RND F F (WOUND CARE) IMPLANT
DRAPE ARM DVNC X/XI (DISPOSABLE) ×12 IMPLANT
DRAPE COLUMN DVNC XI (DISPOSABLE) ×3 IMPLANT
DRAPE DA VINCI XI ARM (DISPOSABLE) ×12
DRAPE DA VINCI XI COLUMN (DISPOSABLE) ×3
DRAPE SURG IRRIG POUCH 19X23 (DRAPES) ×4 IMPLANT
DRSG TEGADERM 4X4.75 (GAUZE/BANDAGES/DRESSINGS) ×1 IMPLANT
ELECT PENCIL ROCKER SW 15FT (MISCELLANEOUS) ×4 IMPLANT
ELECT REM PT RETURN 15FT ADLT (MISCELLANEOUS) ×4 IMPLANT
GAUZE 4X4 16PLY ~~LOC~~+RFID DBL (SPONGE) IMPLANT
GLOVE SURG ENC MOIS LTX SZ6.5 (GLOVE) ×4 IMPLANT
GLOVE SURG ENC TEXT LTX SZ7 (GLOVE) ×8 IMPLANT
GLOVE SURG UNDER POLY LF SZ7.5 (GLOVE) ×8 IMPLANT
GOWN STRL REUS W/TWL LRG LVL3 (GOWN DISPOSABLE) ×12 IMPLANT
HEMOSTAT POWDER SURGIFOAM 1G (HEMOSTASIS) IMPLANT
HEMOSTAT SURGICEL 2X14 (HEMOSTASIS) ×2 IMPLANT
HOLDER FOLEY CATH W/STRAP (MISCELLANEOUS) ×4 IMPLANT
IRRIG SUCT STRYKERFLOW 2 WTIP (MISCELLANEOUS) ×3
IRRIGATION SUCT STRKRFLW 2 WTP (MISCELLANEOUS) ×3 IMPLANT
IV LACTATED RINGERS 1000ML (IV SOLUTION) ×4 IMPLANT
KIT TURNOVER KIT A (KITS) IMPLANT
MARKER SKIN DUAL TIP RULER LAB (MISCELLANEOUS) ×4 IMPLANT
NDL INSUFFLATION 14GA 120MM (NEEDLE) ×3 IMPLANT
NEEDLE INSUFFLATION 14GA 120MM (NEEDLE) ×3 IMPLANT
PACK ROBOT UROLOGY CUSTOM (CUSTOM PROCEDURE TRAY) ×4 IMPLANT
PROTECTOR NERVE ULNAR (MISCELLANEOUS) ×4 IMPLANT
RELOAD STAPLE 45 4.1 GRN THCK (STAPLE) IMPLANT
SEAL CANN UNIV 5-8 DVNC XI (MISCELLANEOUS) ×12 IMPLANT
SEAL XI 5MM-8MM UNIVERSAL (MISCELLANEOUS) ×12
SET TUBE SMOKE EVAC HIGH FLOW (TUBING) ×4 IMPLANT
SOLUTION ELECTROLUBE (MISCELLANEOUS) ×4 IMPLANT
STAPLE RELOAD 45 GRN (STAPLE) ×2 IMPLANT
STAPLE RELOAD 45MM GREEN (STAPLE) ×3
SURGIFLO W/THROMBIN 8M KIT (HEMOSTASIS) ×1 IMPLANT
SUT ETHILON 2 0 PS N (SUTURE) ×1 IMPLANT
SUT MNCRL 3 0 VIOLET RB1 (SUTURE) IMPLANT
SUT MNCRL AB 4-0 PS2 18 (SUTURE) ×8 IMPLANT
SUT MONOCRYL 3 0 RB1 (SUTURE)
SUT PDS AB 0 CT1 36 (SUTURE) ×8 IMPLANT
SUT VIC AB 0 CT1 27 (SUTURE) ×6
SUT VIC AB 0 CT1 27XBRD ANTBC (SUTURE) ×6 IMPLANT
SUT VIC AB 2-0 SH 27 (SUTURE) ×3
SUT VIC AB 2-0 SH 27XBRD (SUTURE) ×3 IMPLANT
SUT VIC AB 3-0 SH 27 (SUTURE)
SUT VIC AB 3-0 SH 27X BRD (SUTURE) IMPLANT
SUT VIC AB 4-0 RB1 27 (SUTURE) ×3
SUT VIC AB 4-0 RB1 27XBRD (SUTURE) IMPLANT
SUT VLOC 3-0 9IN GRN (SUTURE) ×1 IMPLANT
SUT VLOC BARB 180 ABS3/0GR12 (SUTURE) ×9
SUTURE VLOC BRB 180 ABS3/0GR12 (SUTURE) ×6 IMPLANT
TOWEL OR NON WOVEN STRL DISP B (DISPOSABLE) ×4 IMPLANT
WATER STERILE IRR 1000ML POUR (IV SOLUTION) ×4 IMPLANT

## 2021-05-24 NOTE — Progress Notes (Signed)
Pt arrived from PACU in stable condition, AOx4 in no pain with wife at bedside. Transferred to hospital bed with no event. ?

## 2021-05-24 NOTE — Discharge Instructions (Signed)

## 2021-05-24 NOTE — H&P (Signed)
Office Visit Report     05/14/2021   --------------------------------------------------------------------------------   William Kaiser  MRN: [id number]  DOB: August 25, 1965, 56 year old Male  SSN: [id number]   PRIMARY CARE:  Seward Carol  REFERRING:  Seward Carol  PROVIDER:  Rexene Kaiser, M.D.  TREATING:  William Kaiser, William Kaiser  LOCATION:  [facility] [phone]     --------------------------------------------------------------------------------   CC/HPI: Pt presents today for pre-operative history and physical exam in anticipation of robotic assisted lap radical prostatectomy with bilateral pelvic lymph node dissection by William Kaiser on 05/24/21. He is doing well and is without complaint.   Pt denies F/C, HA, CP, SOB, N/V, diarrhea/constipation, back pain, flank pain, hematuria, and dysuria.      HX:   William Kaiser is seen to discuss his new diagnosis of prostate cancer and definitive treatment options.   He is seen with his wife, William Kaiser who works in the lab at [employer].   He has met with Dr. Tammi Klippel. He elects to proceed with robotic assisted laparoscopic radical prostatectomy with possible bilateral pelvic lymph node dissection.   Patient underwent prostate biopsy on 03/05/2021 for an elevated PSA of 4.5 ng/mL on 01/15/2021. Biopsy revealed GS 3+3 =6 in 6/12 cores, adenocarcinoma of the prostate with 6/12 total cores positive (5-10%), TRUS volume of 45 cm3. Denies new or worsening bone or back pain. Good appetite and stable weight.   Family history: His brother was diagnosed with prostate cancer in his 82s  Imaging studies: None   PMH: Diabetes, hyperlipidemia  PSH: None   TNM stage: [id number]  PSA: 4.5 on 01/15/2021  Gleason score: GS 3+3 = 6  Biopsy: 03/05/2021  Left: GS 3+3 = 6 in the left lateral base, left mid  Right: GS 3+3 = 6 in the right lateral base, right base, right mid, right lateral apex  Prostate volume: 45cc^3  PSAD: 0.1   Nomogram   CSS (15 years): 99%  PFS (5 year, 10 year): 93%, 88%  EPE: 20%  LNI: 1%  SVI: 1%   IPSS: 4, quality-of-life 2  SHIM: 12. He has difficulty maintaining and obtaining erections. He does not take PDE 5 inhibitors.   He works in Scientist, research (life sciences) and receiving.     ALLERGIES: No Allergies    MEDICATIONS: Atorvastatin Calcium 40 mg tablet Oral  Losartan Potassium     GU PSH: Cysto Uretero Lithotripsy - 2014, 2014 Cystoscopy Insert Stent - 2014 Prostate Needle Biopsy - 03/05/2021       PSH Notes: Cystoscopy With Ureteroscopy With Lithotripsy, Cystoscopy With Insertion Of Ureteral Stent Right, No Surgical Problems, Cystoscopy With Ureteroscopy With Lithotripsy   NON-GU PSH: Surgical Pathology, Gross And Microscopic Examination For Prostate Needle - 03/05/2021     GU PMH: Stress Incontinence - 05/07/2021, - 04/22/2021 Incomplete bladder emptying - 04/22/2021, - 03/05/2021, - 04/17/2020 Urinary Frequency - 04/22/2021, - 01/24/2021, - 04/17/2020 ED due to arterial insufficiency - 04/17/2021, - 03/13/2021, - 03/05/2021, - 01/24/2021, - 04/17/2020 Prostate Cancer - 04/17/2021, - 03/13/2021 BPH w/LUTS - 03/05/2021, - 01/24/2021, - 04/17/2020 Elevated PSA - 03/05/2021, - 01/24/2021, - 04/17/2020 Premature ejaculation - 04/17/2020 Ureteral calculus, Calculus of ureter - 2014, Calculus of right ureter, - 2014 Renal calculus, Bilateral kidney stones - 2014    NON-GU PMH: Muscle weakness (generalized) - 05/07/2021, - 04/22/2021 Other muscle spasm - 05/07/2021, - 04/22/2021 Encounter for general adult medical examination without abnormal findings, Encounter for preventive health examination - 2014 Personal history of other endocrine, nutritional  and metabolic disease, History of diabetes mellitus - 2014, History of hyperlipidemia, - 2014 Diabetes Type 2    FAMILY HISTORY: 1 son - Runs in Family Diabetes - Father hyperlipidemia - Mother Hypertension - Mother Prostate Cancer - Brother   SOCIAL HISTORY:  Marital Status: Married Ethnicity: Not Hispanic Or Latino; Race: Black or African American Current Smoking Status: Patient has never smoked.   Tobacco Use Assessment Completed: Used Tobacco in last 30 days? Does drink.  Does not use drugs. Drinks 1 caffeinated drink per day. Has not had a blood transfusion.     Notes: ETOH bourbon or beer couple times per week    REVIEW OF SYSTEMS:    GU Review Male:   Patient reports get up at night to urinate. Patient denies frequent urination, hard to postpone urination, burning/ pain with urination, leakage of urine, stream starts and stops, trouble starting your stream, have to strain to urinate , erection problems, and penile pain.  Gastrointestinal (Upper):   Patient denies nausea, vomiting, and indigestion/ heartburn.  Gastrointestinal (Lower):   Patient denies diarrhea and constipation.  Constitutional:   Patient denies night sweats, weight loss, fatigue, and fever.  Skin:   Patient denies skin rash/ lesion and itching.  Eyes:   Patient denies blurred vision and double vision.  Ears/ Nose/ Throat:   Patient denies sore throat and sinus problems.  Hematologic/Lymphatic:   Patient denies swollen glands and easy bruising.  Cardiovascular:   Patient denies leg swelling and chest pains.  Respiratory:   Patient denies cough and shortness of breath.  Endocrine:   Patient denies excessive thirst.  Musculoskeletal:   Patient reports back pain and joint pain.   Neurological:   Patient denies headaches and dizziness.  Psychologic:   Patient denies depression and anxiety.   VITAL SIGNS:      05/14/2021 01:10 PM  Weight 217 lb / 98.43 kg  Height 65 in / 165.1 cm  BP 134/84 mmHg  Pulse 102 /min  Temperature 98.2 F / 36.7 C  BMI 36.1 kg/m   MULTI-SYSTEM PHYSICAL EXAMINATION:    Constitutional: Well-nourished. No physical deformities. Normally developed. Good grooming.  Neck: Neck symmetrical, not swollen. Normal tracheal position.  Respiratory:  Normal breath sounds. No labored breathing, no use of accessory muscles.   Cardiovascular: Regular rate and rhythm. No murmur, no gallop.   Lymphatic: No enlargement of neck, axillae, groin.  Skin: No paleness, no jaundice, no cyanosis. No lesion, no ulcer, no rash.  Neurologic / Psychiatric: Oriented to time, oriented to place, oriented to person. No depression, no anxiety, no agitation.  Gastrointestinal: No mass, no tenderness, no rigidity, obese abdomen.   Eyes: Normal conjunctivae. Normal eyelids.  Ears, Nose, Mouth, and Throat: Left ear no scars, no lesions, no masses. Right ear no scars, no lesions, no masses. Nose no scars, no lesions, no masses. Normal hearing. Normal lips.  Musculoskeletal: Normal gait and station of head and neck.     Complexity of Data:  Records Review:   Previous Patient Records  Urine Test Review:   Urinalysis   05/14/21  Urinalysis  Urine Appearance Clear   Urine Color Yellow   Urine Glucose Neg mg/dL  Urine Bilirubin Neg mg/dL  Urine Ketones Neg mg/dL  Urine Specific Gravity 1.010   Urine Blood Neg ery/uL  Urine pH <=5.0   Urine Protein Neg mg/dL  Urine Urobilinogen 0.2 mg/dL  Urine Nitrites Neg   Urine Leukocyte Esterase Neg leu/uL   PROCEDURES:  Urinalysis - 81003 Dipstick Dipstick Cont'd  Color: Yellow Bilirubin: Neg mg/dL  Appearance: Clear Ketones: Neg mg/dL  Specific Gravity: 1.010 Blood: Neg ery/uL  pH: <=5.0 Protein: Neg mg/dL  Glucose: Neg mg/dL Urobilinogen: 0.2 mg/dL    Nitrites: Neg    Leukocyte Esterase: Neg leu/uL    ASSESSMENT:      ICD-10 Details  1 GU:   Prostate Cancer - C61    PLAN:           Schedule Return Visit/Planned Activity: Keep Scheduled Appointment - Schedule Surgery          Document Letter(s):  Created for Patient: Clinical Summary         Notes:   There are no changes in the patients history or physical exam since last evaluation by William Kaiser. Pt is scheduled to undergo RALP with BPLND on  05/24/21.   All pt's questions were answered to the best of my ability.   Urology Preoperative H&P   Chief Complaint: Prostate cancer  History of Present Illness: William Kaiser is a 56 y.o. male with prostate cancer here for RALP with possible b/l PLND. Denies fevers, chills, dysuria.    Past Medical History:  Diagnosis Date   Cancer ([place])    Diabetes mellitus without complication ([place])    ORAL MED - NEW DIAGNOSIS   History of kidney stones    RIGHT SIDE - CAUSING SEVERE PAIN - PREVIOUS STONE/ SURG   Hypertension     Past Surgical History:  Procedure Laterality Date   CYSTOSCOPY WITH RETROGRADE PYELOGRAM, URETEROSCOPY AND STENT PLACEMENT Right 11/18/2012   Procedure: CYSTOSCOPY WITH RETROGRADE PYELOGRAM, URETEROSCOPY AND STONE EXTRACTION AND right DOUBLE J STENT PLACEMENT;  Surgeon: Franchot Gallo, MD;  Location: [facility];  Service: Urology;  Laterality: Right;   CYSTOSCOPY/RETROGRADE/URETEROSCOPY/STONE EXTRACTION WITH BASKET     HOLMIUM LASER APPLICATION Right 4/48/1856   Procedure: HOLMIUM LASER APPLICATION;  Surgeon: Franchot Gallo, MD;  Location: [facility];  Service: Urology;  Laterality: Right;    Allergies: No Known Allergies  History reviewed. No pertinent family history.  Social History:  reports that he has never smoked. He has never used smokeless tobacco. He reports current alcohol use. He reports that he does not use drugs.  ROS: A complete review of systems was performed.  All systems are negative except for pertinent findings as noted.  Physical Exam:  Vital signs in last 24 hours: Temp:  [98 F (36.7 C)] 98 F (36.7 C) (03/03 0540) Pulse Rate:  [85] 85 (03/03 0540) Resp:  [18] 18 (03/03 0540) BP: (152)/(91) 152/91 (03/03 0540) SpO2:  [100 %] 100 % (03/03 0540) Weight:  [100.4 kg] 100.4 kg (03/03 0547) Constitutional:  Alert and oriented, No acute distress Cardiovascular: Regular rate and rhythm Respiratory: Normal respiratory effort, Lungs clear  bilaterally GI: Abdomen is soft, nontender, nondistended, no abdominal masses GU: No CVA tenderness Lymphatic: No lymphadenopathy Neurologic: Grossly intact, no focal deficits Psychiatric: Normal mood and affect  Laboratory Data:  No results for input(s): WBC, HGB, HCT, PLT in the last 72 hours.  No results for input(s): NA, K, CL, GLUCOSE, BUN, CALCIUM, CREATININE in the last 72 hours.  Invalid input(s): CO3   Results for orders placed or performed during the hospital encounter of 05/24/21 (from the past 24 hour(s))  Glucose, capillary     Status: Abnormal   Collection Time: 05/24/21  6:08 AM  Result Value Ref Range   Glucose-Capillary 108 (H) 70 - 99 mg/dL  Recent Results (from the past 240 hour(s))  SARS CORONAVIRUS 2 (TAT 6-24 HRS) Nasopharyngeal Nasopharyngeal Swab     Status: None   Collection Time: 05/22/21 10:19 AM   Specimen: Nasopharyngeal Swab  Result Value Ref Range Status   SARS Coronavirus 2 NEGATIVE NEGATIVE Final    Comment: (NOTE) SARS-CoV-2 target nucleic acids are NOT DETECTED.  The SARS-CoV-2 RNA is generally detectable in upper and lower respiratory specimens during the acute phase of infection. Negative results do not preclude SARS-CoV-2 infection, do not rule out co-infections with other pathogens, and should not be used as the sole basis for treatment or other patient management decisions. Negative results must be combined with clinical observations, patient history, and epidemiological information. The expected result is Negative.  Fact Sheet for Patients: SugarRoll.be  Fact Sheet for Healthcare Providers: https://www.woods-William.com/  This test is not yet approved or cleared by the [place] FDA and  has been authorized for detection and/or diagnosis of SARS-CoV-2 by FDA under an Emergency Use Authorization (EUA). This EUA will remain  in effect (meaning this test can be used) for the duration of  the COVID-19 declaration under Se ction 564(b)(1) of the Act, 21 U.S.C. section 360bbb-3(b)(1), unless the authorization is terminated or revoked sooner.  Performed at [facility], [place] [address], [place],  [address]     Renal Function: No results for input(s): CREATININE in the last 168 hours. Estimated Creatinine Clearance: 91.5 mL/min (by C-G formula based on SCr of 1 mg/dL).  Radiologic Imaging: No results found.  I independently reviewed the above imaging studies.  Assessment and Plan William Kaiser is a 56 y.o. male with prostate cancer here for RALP with possible b/l PLND.   He has had extensive counseling on treatment options and r/b of each. He desires surgery. We again reviewed r/b of surgery including but not limited to impact on QOL-- most prominently on urinary, bowel, and sexual function. We discussed potential for short and long term urinary incontinence and erectile dysfunction. We discussed potential for surgical complications including infection, bleeding, blood transfusion, injury to urinary and surrounding structures including rectum which could require primary repair or diverting colostomy, vascular injury, neuromuscular injury related to positioning, penile shortening, and bladder neck contracture. We discussed the possibility of positive margins and possible need for adjuvant or salvage therapies in the future. We discussed other perioperative risks including DVT, PE, CVA, MI, pneumonia, and death. He is well informed and all questions were answered.   William R. Deetya Drouillard MD 05/24/2021, 7:05 AM  [facility] Pager: [phone]

## 2021-05-24 NOTE — Anesthesia Procedure Notes (Signed)
Procedure Name: Intubation ?Date/Time: 05/24/2021 7:46 AM ?Performed by: Claudia Desanctis, CRNA ?Pre-anesthesia Checklist: Patient identified, Emergency Drugs available, Suction available and Patient being monitored ?Patient Re-evaluated:Patient Re-evaluated prior to induction ?Oxygen Delivery Method: Circle system utilized ?Preoxygenation: Pre-oxygenation with 100% oxygen ?Induction Type: IV induction ?Ventilation: Mask ventilation without difficulty ?Laryngoscope Size: 2 and Miller ?Grade View: Grade I ?Tube type: Oral ?Tube size: 7.5 mm ?Number of attempts: 1 ?Airway Equipment and Method: Stylet ?Placement Confirmation: ETT inserted through vocal cords under direct vision, positive ETCO2 and breath sounds checked- equal and bilateral ?Secured at: 22 cm ?Tube secured with: Tape ?Dental Injury: Teeth and Oropharynx as per pre-operative assessment  ? ? ? ? ?

## 2021-05-24 NOTE — Transfer of Care (Signed)
Immediate Anesthesia Transfer of Care Note ? ?Patient: William Kaiser ? ?Procedure(s) Performed: XI ROBOTIC ASSISTED LAPAROSCOPIC RADICAL PROSTATECTOMY (Abdomen) ?LYMPHADENECTOMY, PELVIC (Bilateral) ? ?Patient Location: PACU ? ?Anesthesia Type:General ? ?Level of Consciousness: awake and patient cooperative ? ?Airway & Oxygen Therapy: Patient Spontanous Breathing and Patient connected to face mask ? ?Post-op Assessment: Report given to RN and Post -op Vital signs reviewed and stable ? ?Post vital signs: Reviewed and stable ? ?Last Vitals:  ?Vitals Value Taken Time  ?BP 151/116 05/24/21 1251  ?Temp    ?Pulse 92 05/24/21 1255  ?Resp 15 05/24/21 1255  ?SpO2 94 % 05/24/21 1255  ?Vitals shown include unvalidated device data. ? ?Last Pain:  ?Vitals:  ? 05/24/21 0547  ?TempSrc:   ?PainSc: 0-No pain  ?   ? ?  ? ?Complications: No notable events documented. ?

## 2021-05-24 NOTE — Anesthesia Postprocedure Evaluation (Signed)
Anesthesia Post Note ? ?Patient: William Kaiser ? ?Procedure(s) Performed: XI ROBOTIC ASSISTED LAPAROSCOPIC RADICAL PROSTATECTOMY (Abdomen) ?LYMPHADENECTOMY, PELVIC (Bilateral) ? ?  ? ?Patient location during evaluation: PACU ?Anesthesia Type: General ?Level of consciousness: awake and alert ?Pain management: pain level controlled ?Vital Signs Assessment: post-procedure vital signs reviewed and stable ?Respiratory status: spontaneous breathing, nonlabored ventilation, respiratory function stable and patient connected to nasal cannula oxygen ?Cardiovascular status: blood pressure returned to baseline and stable ?Postop Assessment: no apparent nausea or vomiting ?Anesthetic complications: no ? ? ?No notable events documented. ? ?Last Vitals:  ?Vitals:  ? 05/24/21 1315 05/24/21 1330  ?BP: (!) 161/99 (!) 141/99  ?Pulse: 93 91  ?Resp: 16 14  ?Temp:    ?SpO2: 95% 91%  ?  ?Last Pain:  ?Vitals:  ? 05/24/21 1315  ?TempSrc:   ?PainSc: Asleep  ? ? ?  ?  ?  ?  ?  ?  ? ?[place] ? ? ? ? ?

## 2021-05-24 NOTE — Plan of Care (Signed)
?  Problem: Education: ?Goal: Knowledge of General Education information will improve ?Description: Including pain rating scale, medication(s)/side effects and non-pharmacologic comfort measures ?Outcome: Progressing ?  ?Problem: Health Behavior/Discharge Planning: ?Goal: Ability to manage health-related needs will improve ?Outcome: Progressing ?  ?Problem: Clinical Measurements: ?Goal: Ability to maintain clinical measurements within normal limits will improve ?Outcome: Progressing ?Goal: Will remain free from infection ?Outcome: Progressing ?Goal: Diagnostic test results will improve ?Outcome: Progressing ?Goal: Respiratory complications will improve ?Outcome: Progressing ?Goal: Cardiovascular complication will be avoided ?Outcome: Progressing ?  ?Problem: Elimination: ?Goal: Will not experience complications related to bowel motility ?Outcome: Progressing ?Goal: Will not experience complications related to urinary retention ?Outcome: Progressing ?  ?Problem: Fluid Volume: ?Goal: Ability to maintain a balanced intake and output will improve ?Outcome: Progressing ?  ?

## 2021-05-24 NOTE — Op Note (Signed)
Operative Note  Preoperative diagnosis:  1.  Localized prostate cancer  Postoperative diagnosis: 1.  Localized prostate cancer  Procedure(s): 1.  Robotic assisted laparoscopic radical prostatectomy (nerve sparing) 2.  Robotic assisted laparoscopic bilateral pelvic lymph node dissection  Surgeon: Rexene Alberts, MD  Assistants:   Debbrah Alar, PA  An assistant was required for this surgical procedure.  The duties of the assistant included but were not limited to suctioning, passing suture, camera manipulation, retraction.  This procedure would not be able to be performed without an Environmental consultant.   Resident: Reola Mosher, PGY-4  Anesthesia:  General  Complications:  None  EBL:  349ml  Specimens: 1.  Prostate with seminal vesicles 2.  Periprostatic fat 3. Bilateral pelvic lymph nodes  Drains/Catheters: 1.  18 French Foley catheter  Intraoperative findings:   Approximately 45cc prostate.  Right accessory pudendal vessel.  Successful bilateral nerve spare.  Excellent hemostasis.  Water-tight anastomosis without leak.  Indication:  William Kaiser is a 56 y.o. malewho initially presented with an elevated PSA.  Prostate biopsy showed Gleason 3+3 =6 in 6/12 cores prostate cancer involving both sides of the prostate.  Treatment options were discussed with him at length and he chose robotic assisted laparoscopic radical prostatectomy.  He has some difficulty with erections pre-operatively and the plan is for bilateral nerve sparing.  Bilateral pelvic lymph node dissection was planned.  Description of procedure: The indications, alternatives, benefits, and risks were discussed with the patient and informed symptoms obtained.  The patient was brought to the operating room table, positioned supine and secured to the bed with a safety strap.  All pressure points were carefully padded and pneumatic compression devices were placed on lower extremities.  After the administration of intravenous  antibiotics and general endotracheal anesthesia, the patient was repositioned in the dorsal lithotomy position using well-padded Allen stirrups.  The arms were carefully tucked at the patient's side and secured with padding.  The chest was secured in place with foam padding and cloth tape and the table was positioned in approximately 30 degree Trendelenburg.  A rectal exam showed the prostate to be 45 cc, without nodules and not fixed. The patient's abdomen, genitalia, and upper thighs were prepped and draped in the standard sterile manner.  A time was completed, verifying the correct patient, surgical procedure and positioning prior to beginning the procedure.  An 7 French urethral catheter was inserted to drain the bladder.  Pneumoperitoneum was introduced by placing a Veress needle into the abdomen superior to the umbilicus and insufflated with CO2 to a pressure of 15 mmHg. An 8 mm blunt tip trocar was placed just above the umbilicus.  The 0 degree camera was then passed under direct visualization.  The abdominal cavity was examined for any sign of injury, adhesions, and identification of anatomic landmarks.  The remainder of the trochars were placed which included 2 separate 8 millimeter robotic trochars which were placed 9 cm laterally and inferiorly to the initially placed camera trocar.  A 12 mm trocar was placed 8 mm lateral to the right robotic trocar.  A separate 8 mm robotic trocar was placed 8 cm lateral to the previously placed left robotic trocar on the left side.  A 5 mm trocar was placed to the right and well above the umbilicus which approximately 10 and 12 cm away from the right-sided trochars.  The robot was then docked.  I placed monopolar scissors in the right hand, a fenestrated bipolar in the left hand and  a prograsp in the fourth arm.  The urachus and median umbilical ligament was divided and we developed the space of Retzius down to the pubic bone.  I divided the parietal peritoneum  laterally up to the vas deferens on each side.  Using the prograsp forcep to provide cranial traction on the urachus, the prostate was then defatted above the prostatic vesicle junction, and the superficial dorsal venous complex was coagulated with bipolar and divided.  We submitted the periprostatic fat for pathological specimen.  The endopelvic fascia was sharply opened bilaterally and the levator muscle fibers were swept posterior laterally allowing for visualization of the deep dorsal venous complex and apex of the prostate.  The puboprostatic ligaments were sharply divided and care was taken to preserve the dorsal venous complex.  I secured the dorsal venous complex with a battery operated stapler.  I then addressed the bladder neck with a 30 degree down lens. I identified the bladder neck by pulling a Foley catheter.  The fourth arm was applying cranial traction on the urachus.  I divided the anterior bladder neck musculature until I found the anterior bladder neck mucosa which was then incised. I identified the Foley catheter within, the balloon was deflated, we pulled the Foley catheter out into the operating field.  The assistant then used a grasper to apply traction to the Foley catheter and the surgical tech then placed a [place] on the catheter near the penis for optimal retraction.  I then divided the lateral bladder neck mucosa. He did have a sizeable median lobe that was excised. I then excised the posterior bladder neck mucosa.  I was well away from the bilateral ureteral orifices.  I divided the posterior bladder neck musculature until I discovered the longitudinal fibers and kept dissecting until I found the vas deferens.  The bilateral vas deferens were then freed and divided.  I then freed the bilateral seminal vesicles using blunt and sharp dissection.  I placed metal clips on the seminal vesicle vessels and avoided cautery around the seminal vesicles.  I then switched back to a 0 degree  lens.  I divided the Denonvilliers fascia beneath the prostate and developed the prostate off the rectum.  This plane was bluntly developed towards the apex of the prostate.  I then addressed the pedicles, first starting with the right and then moving towards the left. I then did a bilateral nerve spare by dividing the lateral pelvic fascia off of the prostatic capsule laterally.  I then isolated the pedicles of the prostate and placed Weck clips on the pedicles and then divided the pedicles with cold scissors.  I continued to divide the neurovascular bundles off of the prostate out to the apex of the prostate.  At this point the prostate was essentially freed up except for the urethra.   I then addressed the prostate anteriorly, dividing the dorsal vein with cautery.  The anterior urethra was then sharply divided with cold scissors.  The Foley catheter was then pulled back and we divided the posterior urethral wall. Patent venous sinuses were then oversewn with a 4-0 Vicryl suture.  The specimen was then placed in a Endo Catch bag and then the bag was placed in the upper abdomen out of the way.  I then irrigated the pelvis.  We performed a rectal test by instilling air into the rectal Foley.  The test was negative.  There is no concern for rectal injury.  I then over sewed a few bleeders alongside  the pedicles with a 4-0 Vicryl suture on an RB1 needle.  I then performed a bilateral pelvic lymph node dissection by incising the fascia overlying the right external iliac vein, dissecting distally.  I went just distal to the node of [place] were replaced clips and then divided the lymphatics.  The lateral aspect of the dissection was the pelvic sidewall, inferior was the obturator nerve and proximal of the hypogastric vessels.  I placed clips at the proximal aspect and then divided the lymphatics.  The specimen was removed with the scope grasper and sent to pathology.  Grossly, there were no enlarged lymph nodes.   This was performed on both the right and left pelvic lymph nodes.  With good hemostasis confirmed, I then did the posterior reconstruction with a Rocco stitch.  I used a 3-0 Vloc double-armed suture on an RB1 needle.  I passed the sutures through the cut edges of Denonvilliers fascia beneath the bladder on the right side and through the posterior serrated sphincter underneath the urethra.  I ran this from right to left.  And then took the other end of the suture passing just proximal to the posterior bladder neck in the midline into the posterior serrated sphincter and around this from right to left using 3 throws and reapproximated the sutures.  I then completed the urethrovesical anastomosis using a 3-0 Vloc suture on an RB1 needle.  I passed both ends of the suture from the outside in through the bladder neck at the 6 o'clock position.  I passed both through the urethral stump from the inside out and the corresponding position.  I reapproximated the bladder neck to the urethra.  I then ran the left suture on the left side anastomosis to the 9 o'clock position.  And then went back to the right-sided suture around that up the right side of the 12 o'clock position.  I then continued the left suture to the 12 o'clock position. I identified the ureteral orifices and ensured that these were not incorporated with the sutures.  I then placed a new 18 French Foley catheter into the bladder and filled it with 10 cc of sterile water.  I then secured the knot and then passed the suture behind the pubic bone for anterior suspension.  The bladder was irrigated with 200 cc of water.  There was no leak.  Hemostasis was excellent.  A 15 French drain was placed through the previously placed fourth arm.  We did place a Carter-Thomason 0 vicryl suture through the 12 mm assistant port. The robot was undocked and all the trochars were removed under direct vision.    I enlarged the umbilical trocar site large enough to remove  the prostate and closed the fascia with 0 PDS sutures in a running fashion.  All the port sites were irrigated.  Exparel was injected to the trocar sites.  The skin was closed with 4-0 Monocryl in a running subcuticular fashion.  Skin glue was applied.  At this point, the patient was extubated and awakened in the operating room and taken to recovery room in stable condition.  There were no immediate complications.  All counts were correct.  Plan: Admit for observation overnight.  Clear liquids tonight.  Regular for breakfast tomorrow.  Anticipate discharge home tomorrow.  [facility]  Pager: [phone]

## 2021-05-25 ENCOUNTER — Encounter (HOSPITAL_COMMUNITY): Payer: Self-pay | Admitting: Urology

## 2021-05-25 DIAGNOSIS — C61 Malignant neoplasm of prostate: Secondary | ICD-10-CM | POA: Diagnosis not present

## 2021-05-25 LAB — BASIC METABOLIC PANEL
Anion gap: 7 (ref 5–15)
BUN: 10 mg/dL (ref 6–20)
CO2: 23 mmol/L (ref 22–32)
Calcium: 8.7 mg/dL — ABNORMAL LOW (ref 8.9–10.3)
Chloride: 101 mmol/L (ref 98–111)
Creatinine, Ser: 0.88 mg/dL (ref 0.61–1.24)
GFR, Estimated: >60 mL/min (ref >60)
Glucose, Bld: 113 mg/dL — ABNORMAL HIGH (ref 70–99)
Potassium: 3.9 mmol/L (ref 3.5–5.1)
Sodium: 131 mmol/L — ABNORMAL LOW (ref 135–145)

## 2021-05-25 LAB — HEMOGLOBIN AND HEMATOCRIT, BLOOD
HCT: 32.1 % — ABNORMAL LOW (ref 39.0–52.0)
Hemoglobin: 11 g/dL — ABNORMAL LOW (ref 13.0–17.0)

## 2021-05-25 LAB — GLUCOSE, CAPILLARY: Glucose-Capillary: 91 mg/dL (ref 70–99)

## 2021-05-25 NOTE — Discharge Summary (Signed)
[facility] Discharge Summary ? ?Admit date: 05/24/2021 ? ?Discharge date and time: 05/25/21  ? ?Discharge to: Home ? ?Discharge Service: Urology ? ?Discharge Attending Physician:  Dr. Festus Aloe ? ?Discharge  Diagnoses: Prostate cancer ([place]) ? ?Secondary Diagnosis: Principal Problem: ?  Prostate cancer ([place]) ? ? ?OR Procedures: Procedure(s): ?XI ROBOTIC ASSISTED LAPAROSCOPIC RADICAL PROSTATECTOMY ?LYMPHADENECTOMY, PELVIC 05/24/2021 ?  ?Ancillary Procedures: None  ? ?Discharge Day Services: ?The patient was seen and examined by the Urology team both in the morning and immediately prior to discharge.  Vital signs and laboratory values were stable and within normal limits.  The physical exam was benign and unchanged and all surgical wounds were examined.  Discharge instructions were explained and all questions answered. ? ?Subjective  ?No acute events overnight. Pain Controlled. No fever or chills. ? ?Objective ?Patient Vitals for the past 8 hrs: ? BP Temp Temp src Pulse Resp SpO2  ?05/25/21 0626 123/80 98.3 ?F (36.8 ?C) Oral 84 18 98 %  ?05/25/21 0347 (!) 122/92 98.4 ?F (36.9 ?C) Oral 93 18 98 %  ? ?Total I/O ?In: -  ?Out: 600 [Urine:600] ? ?General Appearance:        No acute distress ?Lungs:                       Normal work of breathing on room air ?Heart:                                Regular rate and rhythm ?Abdomen:                         Soft, non-tender, non-distended. Incision are clean/dry/intact ?GU:                   Foley catheter draining clear pink tinged urine ?Extremities:                      Warm and well perfused ? ? ?Hospital Course:  ?The patient underwent robotic radical prostatectomy on 05/24/2021.  The patient tolerated the procedure well, was extubated in the OR, and afterwards was taken to the PACU for routine post-surgical care. When stable the patient was transferred to the floor.   The patient did well postoperatively.  The patient?s diet was slowly advanced and at the time of  discharge was tolerating a regular diet.  The patient was discharged home 1 Day Post-Op, at which point was tolerating a regular solid diet, was tolerating his catheter well, have adequate pain control with P.O. pain medication, and could ambulate without difficulty. The patient will follow up with [place] for post op check.  ? ?Condition at Discharge: Improved ? ?Discharge Medications:  ?Allergies as of 05/25/2021   ?No Known Allergies ?  ? ?  ?Medication List  ?  ? ?STOP taking these medications   ? ?calcium carbonate 1250 (500 Ca) MG chewable tablet ?Commonly known as: OS-CAL ?  ?ciprofloxacin 250 MG tablet ?Commonly known as: Cipro ?  ?ibuprofen 800 MG tablet ?Commonly known as: ADVIL ?  ?levofloxacin 750 MG tablet ?Commonly known as: LEVAQUIN ?  ?MULTIVITAMIN ADULT PO ?  ? ?  ? ?TAKE these medications   ? ?ALPRAZolam 0.25 MG tablet ?Commonly known as: Duanne Moron ?Take 1 tablet (0.25 mg total) by mouth daily as needed. ?  ?atorvastatin 40 MG tablet ?Commonly known as: LIPITOR ?Take 1  tablet (40 mg total) by mouth daily. ?What changed: Another medication with the same name was removed. Continue taking this medication, and follow the directions you see here. ?  ?docusate sodium 100 MG capsule ?Commonly known as: COLACE ?Take 1 capsule (100 mg total) by mouth 2 (two) times daily. ?  ?HYDROcodone-acetaminophen 5-325 MG tablet ?Commonly known as: Norco ?Take 1-2 tablets by mouth every 6 (six) hours as needed for moderate pain. ?  ?losartan 50 MG tablet ?Commonly known as: COZAAR ?TAKE 1 TABLET BY MOUTH ONCE A DAY ?  ?metFORMIN 500 MG tablet ?Commonly known as: GLUCOPHAGE ?TAKE ONE (1) TABLET BY MOUTH TWO (2) TIMES DAILY ?  ?sulfamethoxazole-trimethoprim 800-160 MG tablet ?Commonly known as: BACTRIM DS ?Take 1 tablet by mouth 2 (two) times daily. Start the day prior to foley removal appointment ?  ?Uribel 118 MG Caps ?Take 1 capsule (118 mg total) by mouth every 8 (eight) hours as needed. ?  ? ?  ? ? ? ?

## 2021-05-25 NOTE — Progress Notes (Signed)
Tolerated ambulating around unit this morning. ?

## 2021-05-25 NOTE — Progress Notes (Signed)
JP drain and PIV's removed without issue. Everything WNL. Catheter tip intact. Discharge instructions gone over with patient. No questions or concerns. Pt eating lunch and then will d/c home with wife.  ?

## 2021-05-25 NOTE — Plan of Care (Signed)
?  Problem: Education: ?Goal: Knowledge of General Education information will improve ?Description: Including pain rating scale, medication(s)/side effects and non-pharmacologic comfort measures ?Outcome: Progressing ?  ?Problem: Health Behavior/Discharge Planning: ?Goal: Ability to manage health-related needs will improve ?Outcome: Progressing ?  ?Problem: Clinical Measurements: ?Goal: Ability to maintain clinical measurements within normal limits will improve ?Outcome: Progressing ?Goal: Will remain free from infection ?Outcome: Completed/Met ?Goal: Cardiovascular complication will be avoided ?Outcome: Completed/Met ?  ?Problem: Activity: ?Goal: Risk for activity intolerance will decrease ?Outcome: Progressing ?  ?Problem: Coping: ?Goal: Level of anxiety will decrease ?Outcome: Completed/Met ?  ?Problem: Pain Managment: ?Goal: General experience of comfort will improve ?Outcome: Completed/Met ?  ?

## 2021-05-25 NOTE — Progress Notes (Signed)
Received report from [place], [place]. Patient resting in bed. Alert and oriented x 4. No signs of distress or needs stated. Call bell in reach. Will continue to monitor.  ?

## 2021-05-28 LAB — SURGICAL PATHOLOGY

## 2021-07-29 ENCOUNTER — Other Ambulatory Visit (HOSPITAL_COMMUNITY): Payer: Self-pay

## 2021-07-30 ENCOUNTER — Other Ambulatory Visit (HOSPITAL_COMMUNITY): Payer: Self-pay

## 2021-07-30 MED ORDER — TADALAFIL 5 MG PO TABS
5.0000 mg | ORAL_TABLET | Freq: Every day | ORAL | 3 refills | Status: AC
  Filled 2021-07-30: qty 90, 90d supply, fill #0
  Filled 2021-11-08: qty 90, 90d supply, fill #1
  Filled 2022-02-04: qty 90, 90d supply, fill #2
  Filled 2022-03-12 – 2022-07-27 (×2): qty 90, 90d supply, fill #3

## 2021-08-06 ENCOUNTER — Other Ambulatory Visit (HOSPITAL_COMMUNITY): Payer: Self-pay

## 2021-08-06 MED ORDER — PREDNISONE 10 MG PO TABS
ORAL_TABLET | ORAL | 0 refills | Status: AC
  Filled 2021-08-06: qty 21, 6d supply, fill #0

## 2021-08-12 ENCOUNTER — Other Ambulatory Visit (HOSPITAL_COMMUNITY): Payer: Self-pay

## 2021-08-12 MED ORDER — PREDNISONE 10 MG (21) PO TBPK
ORAL_TABLET | ORAL | 0 refills | Status: AC
  Filled 2021-08-12: qty 21, 6d supply, fill #0

## 2021-09-19 ENCOUNTER — Other Ambulatory Visit (HOSPITAL_COMMUNITY): Payer: Self-pay

## 2021-09-20 ENCOUNTER — Other Ambulatory Visit (HOSPITAL_COMMUNITY): Payer: Self-pay

## 2021-09-25 ENCOUNTER — Other Ambulatory Visit (HOSPITAL_COMMUNITY): Payer: Self-pay

## 2021-09-25 MED ORDER — LOSARTAN POTASSIUM 50 MG PO TABS
50.0000 mg | ORAL_TABLET | Freq: Every day | ORAL | 3 refills | Status: DC
  Filled 2021-09-25: qty 90, 90d supply, fill #0
  Filled 2022-02-04: qty 90, 90d supply, fill #1
  Filled 2022-06-23: qty 90, 90d supply, fill #2

## 2021-09-27 ENCOUNTER — Other Ambulatory Visit (HOSPITAL_COMMUNITY): Payer: Self-pay

## 2021-11-08 ENCOUNTER — Other Ambulatory Visit (HOSPITAL_COMMUNITY): Payer: Self-pay

## 2021-11-11 ENCOUNTER — Other Ambulatory Visit (HOSPITAL_COMMUNITY): Payer: Self-pay

## 2021-12-03 ENCOUNTER — Other Ambulatory Visit: Payer: Self-pay

## 2021-12-03 MED ORDER — PEG 3350-KCL-NA BICARB-NACL 420 G PO SOLR
ORAL | 0 refills | Status: AC
Start: 2021-08-21 — End: ?
  Filled 2021-12-03: qty 4000, 1d supply, fill #0

## 2021-12-05 ENCOUNTER — Other Ambulatory Visit: Payer: Self-pay

## 2022-02-04 ENCOUNTER — Other Ambulatory Visit: Payer: Self-pay

## 2022-02-05 ENCOUNTER — Other Ambulatory Visit: Payer: Self-pay

## 2022-02-05 MED ORDER — ATORVASTATIN CALCIUM 40 MG PO TABS
40.0000 mg | ORAL_TABLET | Freq: Every day | ORAL | 3 refills | Status: DC
  Filled 2022-02-05: qty 90, 90d supply, fill #0
  Filled 2022-06-23: qty 90, 90d supply, fill #1
  Filled 2022-10-14: qty 90, 90d supply, fill #2

## 2022-02-06 ENCOUNTER — Other Ambulatory Visit: Payer: Self-pay

## 2022-03-12 ENCOUNTER — Other Ambulatory Visit: Payer: Self-pay

## 2022-03-13 ENCOUNTER — Other Ambulatory Visit: Payer: Self-pay

## 2022-04-10 ENCOUNTER — Other Ambulatory Visit (HOSPITAL_COMMUNITY): Payer: Self-pay

## 2022-04-10 MED ORDER — TADALAFIL 5 MG PO TABS
5.0000 mg | ORAL_TABLET | Freq: Every day | ORAL | 3 refills | Status: AC | PRN
  Filled 2022-04-10 – 2022-06-23 (×4): qty 90, 90d supply, fill #0
  Filled 2022-10-30 – 2022-11-10 (×4): qty 90, 90d supply, fill #1
  Filled 2023-03-06: qty 90, 90d supply, fill #2

## 2022-04-11 ENCOUNTER — Other Ambulatory Visit: Payer: Self-pay

## 2022-04-14 ENCOUNTER — Other Ambulatory Visit: Payer: Self-pay

## 2022-04-23 ENCOUNTER — Other Ambulatory Visit: Payer: Self-pay

## 2022-06-23 ENCOUNTER — Other Ambulatory Visit: Payer: Self-pay

## 2022-06-25 ENCOUNTER — Other Ambulatory Visit: Payer: Self-pay

## 2022-07-28 ENCOUNTER — Other Ambulatory Visit: Payer: Self-pay

## 2022-07-30 ENCOUNTER — Other Ambulatory Visit: Payer: Self-pay

## 2022-10-14 ENCOUNTER — Other Ambulatory Visit: Payer: Self-pay

## 2022-10-14 MED ORDER — LOSARTAN POTASSIUM 50 MG PO TABS
50.0000 mg | ORAL_TABLET | Freq: Every day | ORAL | 3 refills | Status: AC
  Filled 2022-10-14: qty 90, 90d supply, fill #0
  Filled 2023-03-06: qty 90, 90d supply, fill #1
  Filled 2023-06-26: qty 90, 90d supply, fill #2

## 2022-10-30 ENCOUNTER — Other Ambulatory Visit: Payer: Self-pay

## 2022-11-06 ENCOUNTER — Other Ambulatory Visit: Payer: Self-pay

## 2022-11-10 ENCOUNTER — Other Ambulatory Visit: Payer: Self-pay

## 2022-11-11 ENCOUNTER — Other Ambulatory Visit: Payer: Self-pay

## 2023-03-06 ENCOUNTER — Other Ambulatory Visit: Payer: Self-pay

## 2023-03-06 MED ORDER — ATORVASTATIN CALCIUM 40 MG PO TABS
40.0000 mg | ORAL_TABLET | Freq: Every day | ORAL | 1 refills | Status: AC
  Filled 2023-03-06: qty 90, 90d supply, fill #0
  Filled 2023-06-26: qty 90, 90d supply, fill #1

## 2023-07-03 ENCOUNTER — Other Ambulatory Visit: Payer: Self-pay

## 2023-08-10 ENCOUNTER — Other Ambulatory Visit: Payer: Self-pay

## 2023-11-04 ENCOUNTER — Other Ambulatory Visit: Payer: Self-pay

## 2023-11-04 MED ORDER — LOSARTAN POTASSIUM 50 MG PO TABS
ORAL_TABLET | ORAL | 3 refills | Status: AC
  Filled 2023-11-04 (×2): qty 90, 90d supply, fill #0
  Filled 2024-04-01: qty 90, 90d supply, fill #1

## 2023-11-04 MED ORDER — ATORVASTATIN CALCIUM 40 MG PO TABS
ORAL_TABLET | ORAL | 3 refills | Status: AC
  Filled 2023-11-04: qty 90, 90d supply, fill #0
  Filled 2024-04-01: qty 90, 90d supply, fill #1

## 2023-11-10 ENCOUNTER — Other Ambulatory Visit: Payer: Self-pay

## 2023-11-11 ENCOUNTER — Other Ambulatory Visit: Payer: Self-pay

## 2023-12-17 ENCOUNTER — Other Ambulatory Visit: Payer: Self-pay

## 2024-04-01 ENCOUNTER — Other Ambulatory Visit: Payer: Self-pay

## 2024-04-06 ENCOUNTER — Other Ambulatory Visit: Payer: Self-pay
